# Patient Record
Sex: Male | Born: 1960
Health system: Southern US, Community
[De-identification: ages and names within clinical notes are randomized; demographics above are authoritative.]

## PROBLEM LIST (undated history)

## (undated) DIAGNOSIS — E785 Hyperlipidemia, unspecified: Secondary | ICD-10-CM

---

## 2008-01-19 ENCOUNTER — Ambulatory Visit: Payer: Self-pay | Admitting: Family Medicine

## 2013-07-06 ENCOUNTER — Ambulatory Visit: Payer: Self-pay | Admitting: Family Medicine

## 2013-07-06 ENCOUNTER — Ambulatory Visit: Payer: Self-pay | Admitting: Internal Medicine

## 2013-07-06 LAB — COMPREHENSIVE METABOLIC PANEL
ALBUMIN: 3.4 g/dL (ref 3.4–5.0)
AST: 16 U/L (ref 15–37)
Alkaline Phosphatase: 63 U/L
Anion Gap: 7 (ref 7–16)
BUN: 13 mg/dL (ref 7–18)
Bilirubin,Total: 0.7 mg/dL (ref 0.2–1.0)
CALCIUM: 8.7 mg/dL (ref 8.5–10.1)
CHLORIDE: 103 mmol/L (ref 98–107)
CREATININE: 1.18 mg/dL (ref 0.60–1.30)
Co2: 29 mmol/L (ref 21–32)
EGFR (African American): >60
EGFR (Non-African Amer.): >60
GLUCOSE: 95 mg/dL (ref 65–99)
OSMOLALITY: 277 (ref 275–301)
Potassium: 4.1 mmol/L (ref 3.5–5.1)
SGPT (ALT): 19 U/L (ref 12–78)
SODIUM: 139 mmol/L (ref 136–145)
TOTAL PROTEIN: 7.4 g/dL (ref 6.4–8.2)

## 2013-07-06 LAB — URINALYSIS, COMPLETE
Bacteria: NEGATIVE
Bilirubin,UR: NEGATIVE
Blood: NEGATIVE
Glucose,UR: NEGATIVE mg/dL (ref 0–75)
KETONE: NEGATIVE
Leukocyte Esterase: NEGATIVE
Nitrite: NEGATIVE
PROTEIN: NEGATIVE
Ph: 6 (ref 4.5–8.0)
SQUAMOUS EPITHELIAL: NONE SEEN

## 2013-07-06 LAB — CBC WITH DIFFERENTIAL/PLATELET
BASOS ABS: 0 10*3/uL (ref 0.0–0.1)
Basophil %: 0.7 %
EOS ABS: 0.1 10*3/uL (ref 0.0–0.7)
Eosinophil %: 1.7 %
HCT: 51.6 % (ref 40.0–52.0)
HGB: 16.7 g/dL (ref 13.0–18.0)
LYMPHS PCT: 41.2 %
Lymphocyte #: 2.8 10*3/uL (ref 1.0–3.6)
MCH: 29.4 pg (ref 26.0–34.0)
MCHC: 32.4 g/dL (ref 32.0–36.0)
MCV: 91 fL (ref 80–100)
MONO ABS: 0.8 x10 3/mm (ref 0.2–1.0)
MONOS PCT: 11.4 %
Neutrophil #: 3.1 10*3/uL (ref 1.4–6.5)
Neutrophil %: 45 %
Platelet: 208 10*3/uL (ref 150–440)
RBC: 5.68 10*6/uL (ref 4.40–5.90)
RDW: 13.2 % (ref 11.5–14.5)
WBC: 6.9 10*3/uL (ref 3.8–10.6)

## 2013-07-06 LAB — GC/CHLAMYDIA PROBE AMP

## 2013-07-06 LAB — SEDIMENTATION RATE: ERYTHROCYTE SED RATE: 1 mm/h (ref 0–20)

## 2015-12-16 ENCOUNTER — Encounter: Payer: Self-pay | Admitting: *Deleted

## 2015-12-17 ENCOUNTER — Encounter
Admission: RE | Disposition: A | Discharge status: Home / Self Care | Payer: Self-pay | Source: Ambulatory Visit | Attending: Gastroenterology

## 2015-12-17 ENCOUNTER — Ambulatory Visit: Payer: Commercial Managed Care - HMO | Admitting: Anesthesiology

## 2015-12-17 ENCOUNTER — Encounter: Payer: Self-pay | Admitting: *Deleted

## 2015-12-17 ENCOUNTER — Ambulatory Visit
Admission: RE | Admit: 2015-12-17 | Discharge: 2015-12-17 | Disposition: A | Discharge status: Home / Self Care | Payer: Commercial Managed Care - HMO | Source: Ambulatory Visit | Attending: Gastroenterology | Admitting: Gastroenterology

## 2015-12-17 ENCOUNTER — Other Ambulatory Visit: Payer: Self-pay | Admitting: Gastroenterology

## 2015-12-17 DIAGNOSIS — D123 Benign neoplasm of transverse colon: Secondary | ICD-10-CM | POA: Diagnosis not present

## 2015-12-17 DIAGNOSIS — D125 Benign neoplasm of sigmoid colon: Secondary | ICD-10-CM | POA: Diagnosis not present

## 2015-12-17 DIAGNOSIS — R634 Abnormal weight loss: Secondary | ICD-10-CM

## 2015-12-17 DIAGNOSIS — E785 Hyperlipidemia, unspecified: Secondary | ICD-10-CM | POA: Insufficient documentation

## 2015-12-17 DIAGNOSIS — Z1211 Encounter for screening for malignant neoplasm of colon: Secondary | ICD-10-CM | POA: Insufficient documentation

## 2015-12-17 DIAGNOSIS — Z87891 Personal history of nicotine dependence: Secondary | ICD-10-CM

## 2015-12-17 DIAGNOSIS — Z800 Family history of malignant neoplasm of digestive organs: Secondary | ICD-10-CM | POA: Diagnosis not present

## 2015-12-17 HISTORY — DX: Hyperlipidemia, unspecified: E78.5

## 2015-12-17 HISTORY — PX: COLONOSCOPY WITH PROPOFOL: SHX5780

## 2015-12-17 SURGERY — COLONOSCOPY WITH PROPOFOL
Anesthesia: General

## 2015-12-17 MED ORDER — SODIUM CHLORIDE 0.9 % IV SOLN: INTRAVENOUS | Status: DC

## 2015-12-17 MED ORDER — PROPOFOL 10 MG/ML IV BOLUS
INTRAVENOUS | Status: DC | PRN
  Administered 2015-12-17: 30 mg via INTRAVENOUS

## 2015-12-17 MED ORDER — PROPOFOL 500 MG/50ML IV EMUL
INTRAVENOUS | Status: DC | PRN
  Administered 2015-12-17: 120 ug/kg/min via INTRAVENOUS

## 2015-12-17 MED ORDER — FENTANYL CITRATE (PF) 100 MCG/2ML IJ SOLN
INTRAMUSCULAR | Status: DC | PRN
  Administered 2015-12-17: 50 ug via INTRAVENOUS

## 2015-12-17 MED ORDER — SODIUM CHLORIDE 0.9 % IV SOLN
INTRAVENOUS | Status: DC
  Administered 2015-12-17: 11:00:00 via INTRAVENOUS

## 2015-12-17 MED ORDER — MIDAZOLAM HCL 2 MG/2ML IJ SOLN
INTRAMUSCULAR | Status: DC | PRN
  Administered 2015-12-17: 1 mg via INTRAVENOUS

## 2015-12-17 NOTE — Transfer of Care (Signed)
Immediate Anesthesia Transfer of Care Note  Patient: Barry Lee  Procedure(s) Performed: Procedure(s): COLONOSCOPY WITH PROPOFOL (N/A)  Patient Location: PACU  Anesthesia Type:General  Level of Consciousness: awake and alert   Airway & Oxygen Therapy: Patient Spontanous Breathing and Patient connected to nasal cannula oxygen  Post-op Assessment: Report given to RN  Post vital signs: Reviewed  Last Vitals:  Vitals:   12/17/15 1041  BP: (!) 150/85  Pulse: (!) 44  Resp: 16  Temp: 36.6 C    Last Pain:  Vitals:   12/17/15 1041  TempSrc: Tympanic         Complications: No apparent anesthesia complications

## 2015-12-17 NOTE — H&P (Addendum)
Outpatient short stay form Pre-procedure 12/17/2015 11:12 AM Lollie Sails MD  Primary Physician: Dr. Thereasa Distance  Reason for visit:  Colonoscopy  History of present illness:  Patient is a 55 year old male presenting today for screening colonoscopy. This is his first colonoscopy. He tolerated his prep well. He takes no aspirin or blood thinning agents. There is a family history of colon cancer in 2 primary relatives.    Current Facility-Administered Medications:  .  0.9 %  sodium chloride infusion, , Intravenous, Continuous, Lollie Sails, MD, Last Rate: 20 mL/hr at 12/17/15 1053 .  0.9 %  sodium chloride infusion, , Intravenous, Continuous, Lollie Sails, MD  No prescriptions prior to admission.     No Known Allergies   Past Medical History:  Diagnosis Date  . Hyperlipidemia     Review of systems:      Physical Exam    Heart and lungs: Regular rate and rhythm without rub or gallop, lungs are bilaterally clear.    HEENT: Normocephalic atraumatic eyes are anicteric    Other:     Pertinant exam for procedure: Soft nontender nondistended bowel sounds positive normoactive. There is a firm fullness in the ruq, extending toward the rlq, nontender.     Planned proceedures: Colonoscopy and indicated procedures. I have discussed the risks benefits and complications of procedures to include not limited to bleeding, infection, perforation and the risk of sedation and the patient wishes to proceed.    Lollie Sails, MD Gastroenterology 12/17/2015  11:12 AM

## 2015-12-17 NOTE — Anesthesia Preprocedure Evaluation (Signed)
Anesthesia Evaluation  Patient identified by MRN, date of birth, ID band Patient awake    Reviewed: Allergy & Precautions, NPO status , Patient's Chart, lab work & pertinent test results  Airway Mallampati: II       Dental  (+) Teeth Intact   Pulmonary neg pulmonary ROS, Current Smoker,    breath sounds clear to auscultation       Cardiovascular Exercise Tolerance: Good negative cardio ROS   Rhythm:Regular     Neuro/Psych    GI/Hepatic negative GI ROS, Neg liver ROS,   Endo/Other  negative endocrine ROS  Renal/GU negative Renal ROS     Musculoskeletal negative musculoskeletal ROS (+)   Abdominal Normal abdominal exam  (+)   Peds negative pediatric ROS (+)  Hematology negative hematology ROS (+)   Anesthesia Other Findings   Reproductive/Obstetrics                             Anesthesia Physical Anesthesia Plan  ASA: II  Anesthesia Plan: General   Post-op Pain Management:    Induction: Intravenous  Airway Management Planned: Natural Airway and Nasal Cannula  Additional Equipment:   Intra-op Plan:   Post-operative Plan:   Informed Consent: I have reviewed the patients History and Physical, chart, labs and discussed the procedure including the risks, benefits and alternatives for the proposed anesthesia with the patient or authorized representative who has indicated his/her understanding and acceptance.     Plan Discussed with:   Anesthesia Plan Comments:         Anesthesia Quick Evaluation

## 2015-12-17 NOTE — Anesthesia Postprocedure Evaluation (Signed)
Anesthesia Post Note  Patient: Barry Lee  Procedure(s) Performed: Procedure(s) (LRB): COLONOSCOPY WITH PROPOFOL (N/A)  Patient location during evaluation: PACU Anesthesia Type: General Level of consciousness: awake Pain management: pain level controlled Vital Signs Assessment: post-procedure vital signs reviewed and stable Respiratory status: spontaneous breathing Cardiovascular status: stable Anesthetic complications: no    Last Vitals:  Vitals:   12/17/15 1041  BP: (!) 150/85  Pulse: (!) 44  Resp: 16  Temp: 36.6 C    Last Pain:  Vitals:   12/17/15 1041  TempSrc: Tympanic                 VAN STAVEREN,Sonnia Strong

## 2015-12-17 NOTE — Op Note (Signed)
[facility] Gastroenterology Patient Name: Barry Lee Procedure Date: 12/17/2015 11:15 AM MRN: [id number] Account #: [id number] Date of Birth: 01/04/1961 Admit Type: Outpatient Age: 55 Room: [facility] ENDO ROOM 1 Gender: Male Note Status: Finalized Procedure:            Colonoscopy Indications:          Colon cancer screening in patient at increased risk:                        Colorectal cancer in brother Providers:            Lollie Sails, MD Referring MD:         Sofie Hartigan (Referring MD) Medicines:            Monitored Anesthesia Care Complications:        No immediate complications. Procedure:            Pre-Anesthesia Assessment:                       - ASA Grade Assessment: II - A patient with mild                        systemic disease.                       After obtaining informed consent, the colonoscope was                        passed under direct vision. Throughout the procedure,                        the patient's blood pressure, pulse, and oxygen                        saturations were monitored continuously. The                        Colonoscope was introduced through the anus and                        advanced to the the cecum, identified by appendiceal                        orifice and ileocecal valve. The patient tolerated the                        procedure well. The quality of the bowel preparation                        was fair. Findings:      A 2 mm polyp was found in the hepatic flexure. The polyp was sessile.       The polyp was removed with a cold biopsy forceps. Resection and       retrieval were complete.      A 1 mm polyp was found in the distal sigmoid colon. The polyp was       sessile. The polyp was removed with a cold biopsy forceps. Resection and       retrieval were complete.      The exam was otherwise normal throughout the examined colon.  The digital rectal exam was normal. Impression:           -  Preparation of the colon was fair.                       - One 2 mm polyp at the hepatic flexure, removed with a                        cold biopsy forceps. Resected and retrieved.                       - One 1 mm polyp in the distal sigmoid colon, removed                        with a cold biopsy forceps. Resected and retrieved. Recommendation:       - Discharge patient to home.                       - Await pathology results.                       - Telephone GI clinic for pathology results in 1 week.                       - On physical examination, there is a firm fullness in                        the ruq on palpation. Will order ruq ultrasound for                        abnormal abdominal examination. Procedure Code(s):    --- Professional ---                       [phone], Colonoscopy, flexible; with biopsy, single or                        multiple Diagnosis Code(s):    --- Professional ---                       Z80.0, Family history of malignant neoplasm of                        digestive organs                       D12.3, Benign neoplasm of transverse colon (hepatic                        flexure or splenic flexure)                       D12.5, Benign neoplasm of sigmoid colon CPT copyright 2016 American Medical Association. All rights reserved. The codes documented in this report are preliminary and upon coder review may  be revised to meet current compliance requirements. Lollie Sails, MD 12/17/2015 11:59:38 AM This report has been signed electronically. Number of Addenda: 0 Note Initiated On: 12/17/2015 11:15 AM Scope Withdrawal Time: 0 hours 15 minutes 6 seconds  Total Procedure Duration: 0 hours 26 minutes 59 seconds       [facility]

## 2015-12-18 ENCOUNTER — Encounter: Payer: Self-pay | Admitting: Gastroenterology

## 2015-12-19 LAB — SURGICAL PATHOLOGY

## 2015-12-23 ENCOUNTER — Ambulatory Visit: Admission: RE | Admit: 2015-12-23 | Payer: Commercial Managed Care - HMO | Source: Ambulatory Visit

## 2015-12-25 ENCOUNTER — Other Ambulatory Visit: Payer: Self-pay | Admitting: Gastroenterology

## 2015-12-25 DIAGNOSIS — R198 Other specified symptoms and signs involving the digestive system and abdomen: Secondary | ICD-10-CM

## 2016-01-01 ENCOUNTER — Ambulatory Visit: Admission: RE | Admit: 2016-01-01 | Payer: Commercial Managed Care - HMO | Source: Ambulatory Visit

## 2016-01-05 ENCOUNTER — Ambulatory Visit
Admission: RE | Admit: 2016-01-05 | Discharge: 2016-01-05 | Disposition: A | Discharge status: Home / Self Care | Payer: Commercial Managed Care - HMO | Source: Ambulatory Visit | Attending: Gastroenterology | Admitting: Gastroenterology

## 2016-01-05 DIAGNOSIS — R198 Other specified symptoms and signs involving the digestive system and abdomen: Secondary | ICD-10-CM | POA: Diagnosis not present

## 2017-03-04 ENCOUNTER — Encounter: Payer: Self-pay | Admitting: Podiatry

## 2017-03-04 ENCOUNTER — Ambulatory Visit (INDEPENDENT_AMBULATORY_CARE_PROVIDER_SITE_OTHER): Payer: 59 | Admitting: Podiatry

## 2017-03-04 DIAGNOSIS — Q828 Other specified congenital malformations of skin: Secondary | ICD-10-CM

## 2017-03-04 DIAGNOSIS — I739 Peripheral vascular disease, unspecified: Secondary | ICD-10-CM

## 2017-03-04 MED ORDER — UREA 39 % EX CREA: 1.0000 "application " | TOPICAL_CREAM | Freq: Every day | CUTANEOUS | 0 refills | Status: AC

## 2017-03-04 NOTE — Progress Notes (Signed)
Subjective:    Patient ID: Barry Lee, male    DOB: 1961-01-05, 56 y.o.   MRN: [id number]  HPI  56 year old male presents the office today for concerns of painful calluses to both of his feet. He states the left side is more painful than the right. He states that the pain and left foot goes into his leg. He denies any claudication symptoms and denies any numbness or tingling. He does stand working on hard surface for 8-10 hours a day. He has no other concerns today. He denies any recent swelling or redness or drainage coming from the calluses.   Review of Systems  All other systems reviewed and are negative.  Past Medical History:  Diagnosis Date  . Hyperlipidemia     Past Surgical History:  Procedure Laterality Date  . COLONOSCOPY WITH PROPOFOL N/A 12/17/2015   Procedure: COLONOSCOPY WITH PROPOFOL;  Surgeon: Lollie Sails, MD;  Location: [facility] ENDOSCOPY;  Service: Endoscopy;  Laterality: N/A;     Current Outpatient Prescriptions:  .  Urea 39 % CREA, Apply 1 application topically daily., Disp: 1 Bottle, Rfl: 0  No Known Allergies  Social History   Social History  . Marital status: Married    Spouse name: N/A  . Number of children: N/A  . Years of education: N/A   Occupational History  . Not on file.   Social History Main Topics  . Smoking status: Current Every Day Smoker    Types: Cigars  . Smokeless tobacco: Never Used  . Alcohol use No     Comment: rare  . Drug use: No  . Sexual activity: Not on file   Other Topics Concern  . Not on file   Social History Narrative  . No narrative on file         Objective:   Physical Exam General: AAO x3, NAD  Dermatological: Multiple hyperkeratotic lesions present bilateral submetatarsal as well as the heel. Upon debridement there is no underlying ulceration, drainage or any clinical signs of infection present. There is no other open lesions or pre-ulcerous at this time.  Vascular: On the right side DP/PT  pulse 2/4 on the left side it does appear to be decreased at 1/4 to the [facility] and PT compared to the contralateral extremity.  Neruologic: Grossly intact via light touch bilateral. Protective threshold with Semmes Wienstein monofilament intact to all pedal sites bilateral.   Musculoskeletal: Tenderness of the hyperkeratotic lesions. This prominence the metatarsal heads plantarly. No other areas of tenderness there is no area of pinpoint bony tenderness. Muscular strength 5/5 in all groups tested bilateral.  Gait: Unassisted, Nonantalgic.     Assessment & Plan:  56 year old male with symptomatic calluses bilaterally; decreased pulses left side with current tobacco use -Treatment options discussed including all alternatives, risks, and complications -Etiology of symptoms were discussed -I debrided all hyperkeratotic lesions  without any complications or bleeding. Discussed with him long-term potential surgery but I think we would benefit more from started with an accommodative type orthotic. I would have him follow up with Liliane Channel to get molded for inserts. -Given the decreased pulses in the left side compared to contralateral extremity as well as pain and he can tell leg I did an EBI the office. On the left-sided is 0.86 in the right was 1.26. Although mildly abnormal given the decreased pulses I will order an arterial duplex and schedule consultation for this given the abnormal result on the pilot study.   Celesta Gentile, DPM

## 2017-03-07 ENCOUNTER — Encounter (HOSPITAL_COMMUNITY): Payer: Self-pay | Admitting: Cardiovascular Disease

## 2017-03-07 ENCOUNTER — Telehealth: Payer: Self-pay | Admitting: *Deleted

## 2017-03-07 DIAGNOSIS — R0989 Other specified symptoms and signs involving the circulatory and respiratory systems: Secondary | ICD-10-CM

## 2017-03-07 NOTE — Telephone Encounter (Signed)
-----   Message from Trula Slade, DPM sent at 03/07/2017  8:04 AM EDT ----- Can you please put in for an arterial duplex and follow-up/consult given abnormal reading on the pilot ABI study? Thanks.

## 2017-03-10 ENCOUNTER — Other Ambulatory Visit: Payer: Self-pay | Admitting: Podiatry

## 2017-03-10 DIAGNOSIS — R0989 Other specified symptoms and signs involving the circulatory and respiratory systems: Secondary | ICD-10-CM

## 2017-03-17 ENCOUNTER — Emergency Department: Payer: 59

## 2017-03-17 ENCOUNTER — Encounter: Payer: Self-pay | Admitting: Emergency Medicine

## 2017-03-17 ENCOUNTER — Inpatient Hospital Stay
Admission: EM | Admit: 2017-03-17 | Discharge: 2017-03-19 | DRG: 271 | Disposition: A | Discharge status: Home / Self Care | Payer: 59 | Attending: Internal Medicine | Admitting: Internal Medicine

## 2017-03-17 ENCOUNTER — Ambulatory Visit: Payer: 59 | Admitting: Orthotics

## 2017-03-17 ENCOUNTER — Other Ambulatory Visit: Payer: Self-pay

## 2017-03-17 ENCOUNTER — Ambulatory Visit (INDEPENDENT_AMBULATORY_CARE_PROVIDER_SITE_OTHER): Payer: 59

## 2017-03-17 DIAGNOSIS — R7301 Impaired fasting glucose: Secondary | ICD-10-CM | POA: Diagnosis present

## 2017-03-17 DIAGNOSIS — M722 Plantar fascial fibromatosis: Secondary | ICD-10-CM

## 2017-03-17 DIAGNOSIS — Z79899 Other long term (current) drug therapy: Secondary | ICD-10-CM

## 2017-03-17 DIAGNOSIS — R0989 Other specified symptoms and signs involving the circulatory and respiratory systems: Secondary | ICD-10-CM | POA: Diagnosis not present

## 2017-03-17 DIAGNOSIS — F17200 Nicotine dependence, unspecified, uncomplicated: Secondary | ICD-10-CM | POA: Diagnosis not present

## 2017-03-17 DIAGNOSIS — F17210 Nicotine dependence, cigarettes, uncomplicated: Secondary | ICD-10-CM | POA: Diagnosis present

## 2017-03-17 DIAGNOSIS — Z820 Family history of epilepsy and other diseases of the nervous system: Secondary | ICD-10-CM

## 2017-03-17 DIAGNOSIS — E785 Hyperlipidemia, unspecified: Secondary | ICD-10-CM | POA: Diagnosis present

## 2017-03-17 DIAGNOSIS — I749 Embolism and thrombosis of unspecified artery: Secondary | ICD-10-CM

## 2017-03-17 DIAGNOSIS — I739 Peripheral vascular disease, unspecified: Secondary | ICD-10-CM

## 2017-03-17 DIAGNOSIS — M79605 Pain in left leg: Secondary | ICD-10-CM | POA: Diagnosis present

## 2017-03-17 DIAGNOSIS — I70222 Atherosclerosis of native arteries of extremities with rest pain, left leg: Principal | ICD-10-CM | POA: Diagnosis present

## 2017-03-17 DIAGNOSIS — I7092 Chronic total occlusion of artery of the extremities: Secondary | ICD-10-CM | POA: Diagnosis present

## 2017-03-17 DIAGNOSIS — I708 Atherosclerosis of other arteries: Secondary | ICD-10-CM | POA: Diagnosis present

## 2017-03-17 DIAGNOSIS — I998 Other disorder of circulatory system: Secondary | ICD-10-CM | POA: Diagnosis present

## 2017-03-17 DIAGNOSIS — I70229 Atherosclerosis of native arteries of extremities with rest pain, unspecified extremity: Secondary | ICD-10-CM

## 2017-03-17 DIAGNOSIS — Z833 Family history of diabetes mellitus: Secondary | ICD-10-CM | POA: Diagnosis not present

## 2017-03-17 DIAGNOSIS — Q828 Other specified congenital malformations of skin: Secondary | ICD-10-CM

## 2017-03-17 LAB — CBC WITH DIFFERENTIAL/PLATELET
Basophils Absolute: 0.1 10*3/uL (ref 0–0.1)
Basophils Relative: 1 %
Eosinophils Absolute: 0.1 10*3/uL (ref 0–0.7)
Eosinophils Relative: 1 %
HEMATOCRIT: 50.9 % (ref 40.0–52.0)
HEMOGLOBIN: 16.6 g/dL (ref 13.0–18.0)
LYMPHS ABS: 2.8 10*3/uL (ref 1.0–3.6)
LYMPHS PCT: 34 %
MCH: 30.6 pg (ref 26.0–34.0)
MCHC: 32.6 g/dL (ref 32.0–36.0)
MCV: 93.7 fL (ref 80.0–100.0)
MONOS PCT: 10 %
Monocytes Absolute: 0.9 10*3/uL (ref 0.2–1.0)
NEUTROS PCT: 54 %
Neutro Abs: 4.5 10*3/uL (ref 1.4–6.5)
Platelets: 169 10*3/uL (ref 150–440)
RBC: 5.43 MIL/uL (ref 4.40–5.90)
RDW: 14.4 % (ref 11.5–14.5)
WBC: 8.3 10*3/uL (ref 3.8–10.6)

## 2017-03-17 LAB — BASIC METABOLIC PANEL
Anion gap: 8 (ref 5–15)
BUN: 10 mg/dL (ref 6–20)
CHLORIDE: 104 mmol/L (ref 101–111)
CO2: 27 mmol/L (ref 22–32)
Calcium: 8.8 mg/dL — ABNORMAL LOW (ref 8.9–10.3)
Creatinine, Ser: 0.91 mg/dL (ref 0.61–1.24)
GFR calc Af Amer: >60 mL/min (ref >60)
GFR calc non Af Amer: >60 mL/min (ref >60)
GLUCOSE: 79 mg/dL (ref 65–99)
POTASSIUM: 4.2 mmol/L (ref 3.5–5.1)
Sodium: 139 mmol/L (ref 135–145)

## 2017-03-17 LAB — APTT: aPTT: 32 seconds (ref 24–36)

## 2017-03-17 LAB — LACTIC ACID, PLASMA: LACTIC ACID, VENOUS: 1.4 mmol/L (ref 0.5–1.9)

## 2017-03-17 LAB — PROTIME-INR
INR: 0.93
Prothrombin Time: 12.4 seconds (ref 11.4–15.2)

## 2017-03-17 MED ORDER — ONDANSETRON HCL 4 MG/2ML IJ SOLN: 4.0000 mg | Freq: Four times a day (QID) | INTRAMUSCULAR | Status: DC | PRN

## 2017-03-17 MED ORDER — ACETAMINOPHEN 650 MG RE SUPP
650.0000 mg | Freq: Four times a day (QID) | RECTAL | Status: DC | PRN
Start: 2017-03-17 — End: 2017-03-19

## 2017-03-17 MED ORDER — IOPAMIDOL (ISOVUE-370) INJECTION 76%
125.0000 mL | Freq: Once | INTRAVENOUS | Status: AC | PRN
  Administered 2017-03-17: 125 mL via INTRAVENOUS

## 2017-03-17 MED ORDER — SODIUM CHLORIDE 0.9 % IV BOLUS (SEPSIS)
1000.0000 mL | Freq: Once | INTRAVENOUS | Status: AC
  Administered 2017-03-17: 1000 mL via INTRAVENOUS

## 2017-03-17 MED ORDER — HEPARIN BOLUS VIA INFUSION
4600.0000 [IU] | Freq: Once | INTRAVENOUS | Status: AC
  Administered 2017-03-17: 4600 [IU] via INTRAVENOUS
  Filled 2017-03-17: qty 4600

## 2017-03-17 MED ORDER — ACETAMINOPHEN 325 MG PO TABS: 650.0000 mg | ORAL_TABLET | Freq: Four times a day (QID) | ORAL | Status: DC | PRN

## 2017-03-17 MED ORDER — HEPARIN (PORCINE) IN NACL 100-0.45 UNIT/ML-% IJ SOLN
1300.0000 [IU]/h | INTRAMUSCULAR | Status: DC
  Administered 2017-03-17: 1300 [IU]/h via INTRAVENOUS
  Filled 2017-03-17 (×2): qty 250

## 2017-03-17 MED ORDER — MORPHINE SULFATE (PF) 2 MG/ML IV SOLN: 2.0000 mg | INTRAVENOUS | Status: DC | PRN

## 2017-03-17 MED ORDER — ONDANSETRON HCL 4 MG PO TABS: 4.0000 mg | ORAL_TABLET | Freq: Four times a day (QID) | ORAL | Status: DC | PRN

## 2017-03-17 MED ORDER — HYDROCODONE-ACETAMINOPHEN 5-325 MG PO TABS: 1.0000 | ORAL_TABLET | ORAL | Status: DC | PRN

## 2017-03-17 NOTE — H&P (Signed)
[place] [facility] [place] NAME: Barry Lee    MR#:  [id number]  DATE OF BIRTH:  [birth year]  DATE OF ADMISSION:  03/17/2017  PRIMARY CARE PHYSICIAN: Ok Edwards, NP   REQUESTING/REFERRING PHYSICIAN: Dr. Merlyn Lot  CHIEF COMPLAINT:   Chief Complaint  Patient presents with  . Leg Pain    HISTORY OF PRESENT ILLNESS:  Barry Lee  is a 56 y.o. male with a known history of tobacco abuse who presents to the hospital due to left lower extremity pain. Patient says he developed some left lower extremity pain 2 weeks ago while he was mowing the lawn but it resolved on its own. 2 nights ago patient developed some left foot pain in the middle lobe and right which kept him up most of the night but it eventually resolved. Patient had another episode of left foot pain last night and therefore was concerned and therefore came to the ER for further evaluation today. Patient underwent a CT angiogram showed a thrombus in the left tibial and peroneal tibial trunk along with a complete occlusion of the left common femoral artery. Patient has been initiated on a heparin drip and hospitalist services were contacted further treatment evaluation.  PAST MEDICAL HISTORY:   Past Medical History:  Diagnosis Date  . Hyperlipidemia     PAST SURGICAL HISTORY:  History reviewed. No pertinent surgical history.  SOCIAL HISTORY:   Social History   Tobacco Use  . Smoking status: Current Every Day Smoker    Packs/day: 0.75    Years: 30.00    Pack years: 22.50    Types: Cigars, Cigarettes  . Smokeless tobacco: Never Used  Substance Use Topics  . Alcohol use: No    Comment: rare    FAMILY HISTORY:   Family History  Problem Relation Age of Onset  . Diabetes Mother   . Alzheimer's disease Father     DRUG ALLERGIES:  No Known Allergies  REVIEW OF SYSTEMS:   Review of Systems  Constitutional: Negative for fever and weight loss.  HENT:  Negative for congestion, nosebleeds and tinnitus.   Eyes: Negative for blurred vision, double vision and redness.  Respiratory: Negative for cough, hemoptysis and shortness of breath.   Cardiovascular: Negative for chest pain, orthopnea, leg swelling and PND.  Gastrointestinal: Negative for abdominal pain, diarrhea, melena, nausea and vomiting.  Genitourinary: Negative for dysuria, hematuria and urgency.  Musculoskeletal: Negative for falls and joint pain.  Neurological: Negative for dizziness, tingling, sensory change, focal weakness, seizures, weakness and headaches.  Endo/Heme/Allergies: Negative for polydipsia. Does not bruise/bleed easily.  Psychiatric/Behavioral: Negative for depression and memory loss. The patient is not nervous/anxious.     MEDICATIONS AT HOME:   Prior to Admission medications   Medication Sig Start Date End Date Taking? Authorizing Provider  Urea 39 % CREA Apply 1 application topically daily. 03/04/17  Yes Trula Slade, DPM      VITAL SIGNS:  Blood pressure 139/82, pulse (!) 51, temperature 97.9 F (36.6 C), temperature source Oral, resp. rate 16, height 6' (1.829 m), weight 77.1 kg (170 lb), SpO2 99 %.  PHYSICAL EXAMINATION:  Physical Exam  GENERAL:  56 y.o.-year-old patient lying in the bed in no acute distress.  EYES: Pupils equal, round, reactive to light and accommodation. No scleral icterus. Extraocular muscles intact.  HEENT: Head atraumatic, normocephalic. Oropharynx and nasopharynx clear. No oropharyngeal erythema, moist oral mucosa  NECK:  Supple, no jugular venous distention. No thyroid  enlargement, no tenderness.  LUNGS: Normal breath sounds bilaterally, no wheezing, rales, rhonchi. No use of accessory muscles of respiration.  CARDIOVASCULAR: S1, S2 RRR. No murmurs, rubs, gallops, clicks.  ABDOMEN: Soft, nontender, nondistended. Bowel sounds present. No organomegaly or mass.  EXTREMITIES: No pedal edema, cyanosis, or clubbing.  Cool left  foot but no cyanosis.   NEUROLOGIC: Cranial nerves II through XII are intact. No focal Motor or sensory deficits appreciated b/l PSYCHIATRIC: The patient is alert and oriented x 3. Good affect.  SKIN: No obvious rash, lesion, or ulcer.   LABORATORY PANEL:   CBC Recent Labs  Lab 03/17/17 1613  WBC 8.3  HGB 16.6  HCT 50.9  PLT 169   ------------------------------------------------------------------------------------------------------------------  Chemistries  Recent Labs  Lab 03/17/17 1613  NA 139  K 4.2  CL 104  CO2 27  GLUCOSE 79  BUN 10  CREATININE 0.91  CALCIUM 8.8*   ------------------------------------------------------------------------------------------------------------------  Cardiac Enzymes No results for input(s): TROPONINI in the last 168 hours. ------------------------------------------------------------------------------------------------------------------  RADIOLOGY:  Ct Angio Aortobifemoral W And/or Wo Contrast  Result Date: 03/17/2017 CLINICAL DATA:  Severe left foot pain this morning. Intermittent Doppler pulses. EXAM: CT ANGIOGRAPHY AOBIFEM WITHOUT AND WITH CONTRAST<Procedure Description>CT ANGIOGRAPHY AOBIFEM WITHOUT AND WITH CONTRAST TECHNIQUE: Axial images were obtained from the level of the lower chest through the feet after administration of IV contrast material. Coronal and sagittal reconstructions were performed. CONTRAST:  153mL ISOVUE-370 IOPAMIDOL (ISOVUE-370) INJECTION 76%<Contrast>149mL ISOVUE-370 IOPAMIDOL (ISOVUE-370) INJECTION 76% COMPARISON:  None. FINDINGS: Scattered mild atherosclerosis of the normal caliber abdominal aorta. Normal flow within the celiac, superior mesenteric and inferior mesenteric artery branches. Complete occlusion within the proximal portion of the left common iliac artery. No flow seen within the left external iliac artery. There is reconstitution of flow within the left internal iliac artery and at the left femoral  artery via collaterals. Normal flow is seen throughout the left SFA and profundus arteries. Normal flow is seen within the left popliteal artery. Partially occlusive thrombus noted at the junction of the left anterior tibial artery and tibioperoneal trunk. This is reflected by diminished flow in the anterior tibial artery and no appreciable flow in the anterior tibial artery just above the level of the left ankle. Some flow is seen within the left peroneal and posterior tibial arteries. There is normal flow throughout the right lower extremity. Liver, gallbladder, pancreas, and spleen appear normal. Adrenal glands and kidneys are unremarkable. Small right renal cyst. Incidental note made of a retroaortic left renal vein. No dilated large or small bowel loops. No evidence of bowel wall thickening or bowel wall inflammation seen. No free fluid or abscess collection within the abdomen or pelvis. No free intraperitoneal air seen. Osseous structures are unremarkable. Lung bases are clear. Review of the MIP images confirms the above findings. IMPRESSION: 1. 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 There is associated diminished flow within the left anterior tibial artery with no appreciable flow in the left anterior tibia artery just above the level of the left ankle. Grossly normal contrast flow is seen within the left peroneal and posterior tibial arteries to the level of the ankle. 2. 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 Reconstitution of flow within the left femoral artery via collaterals with normal contrast flow seen through  the left SFA and left popliteal artery. There is also reconstitution of flow in the left internal iliac artery via collaterals. 3. Normal contrast flow  throughout the right lower extremity arteries. 4. No acute appearing findings within the abdomen or pelvis. No evidence of neoplastic process seen. These results were called by telephone at the time of interpretation on 03/17/2017 at 6:30 pm to Dr. Merlyn Lot , who verbally acknowledged these results. Electronically Signed   By: Franki Cabot M.D.   On: 03/17/2017 18:34   Dg Chest Portable 1 View  Result Date: 03/17/2017 CLINICAL DATA:  Cough tobacco use.  Lower extremity claudication EXAM: PORTABLE CHEST 1 VIEW COMPARISON:  None. FINDINGS: The lungs are clear. Heart size and pulmonary vascularity are normal. No adenopathy. No bone lesions. Trachea appears normal. IMPRESSION: No edema or consolidation. Electronically Signed   By: Lowella Grip III M.D.   On: 03/17/2017 16:32     IMPRESSION AND PLAN:   56 year old male with past medical history of tobacco abuse who presents to the hospital due to left lower extremity pain.  1. Acute left lower extremity ischemia-this is the cause of patient's left lower extremity pain and swelling. -Patient has been initiated on a heparin drip. - We will get a vascular surgery consult, supportive care with pain control with IV morphine and oxycodone.  2. Tobacco abuse - pt. Refused a nicotine patch.      All the records are reviewed and case discussed with ED provider. Management plans discussed with the patient, family and they are in agreement.  CODE STATUS: Full code  TOTAL TIME TAKING CARE OF THIS PATIENT: 40 minutes.    Henreitta Leber M.D on 03/17/2017 at 7:25 PM  Between 7am to 6pm - Pager - [phone]  After 6pm go to [email or web address] - password [email or web address] [facility] [facility]  Office  [phone]  CC: Primary care physician; Ok Edwards, NP

## 2017-03-17 NOTE — ED Provider Notes (Signed)
[facility] Emergency Department Provider Note    None    (approximate)  I have reviewed the triage vital signs and the nursing notes.   HISTORY  Chief Complaint Leg Pain    HPI KEATYN JAWAD is a 56 y.o. male presents with chief complaint of worsening left leg pain as well as what was initially claudications down symptoms starting roughly 2 weeks ago now presents with worsening rest pain and burning sensation anytime he stands on his left foot.  Patient was seen by his foot doctor today and sent for peripheral arterial studies and they are unable to obtain arterial signals even up to the left femoral artery.  Had a cold leg and so he was sent to the ER for emergent evaluation.  States he does smoke roughly 1 pack/day since he was in college.  Denies any history of blood disorders.  No previous history of heart attack.  Denies any chest pain or shortness of breath.  No belly pain.  Denies any trauma.  Describes the pain is a burning sensation in mild to moderate in severity.  It is worse with standing.  Past Medical History:  Diagnosis Date  . Hyperlipidemia    Family History  Problem Relation Age of Onset  . Diabetes Mother   . Alzheimer's disease Father    History reviewed. No pertinent surgical history. Patient Active Problem List   Diagnosis Date Noted  . Ischemia of left lower extremity 03/17/2017      Prior to Admission medications   Medication Sig Start Date End Date Taking? Authorizing Provider  Urea 39 % CREA Apply 1 application topically daily. 03/04/17  Yes Trula Slade, DPM    Allergies Patient has no known allergies.    Social History Social History   Tobacco Use  . Smoking status: Current Every Day Smoker    Packs/day: 0.75    Years: 30.00    Pack years: 22.50    Types: Cigars, Cigarettes  . Smokeless tobacco: Never Used  Substance Use Topics  . Alcohol use: No    Comment: rare  . Drug use: No    Review of  Systems Patient denies headaches, rhinorrhea, blurry vision, numbness, shortness of breath, chest pain, edema, cough, abdominal pain, nausea, vomiting, diarrhea, dysuria, fevers, rashes or hallucinations unless otherwise stated above in HPI. ____________________________________________   PHYSICAL EXAM:  VITAL SIGNS: Vitals:   03/17/17 1814 03/17/17 1830  BP: (!) 155/86 139/82  Pulse: (!) 52 (!) 51  Resp: 12 16  Temp:    SpO2: 100% 99%    Constitutional: Alert and oriented. Well appearing and in no acute distress. Eyes: Conjunctivae are normal.  Head: Atraumatic. Nose: No congestion/rhinnorhea. Mouth/Throat: Mucous membranes are moist.   Neck: No stridor. Painless ROM.  Cardiovascular: Normal rate, regular rhythm. Grossly normal heart sounds.  Good peripheral circulation. Respiratory: Normal respiratory effort.  No retractions. Lungs CTAB. Gastrointestinal: Soft and nontender. No distention. No abdominal bruits. No CVA tenderness. Musculoskeletal: No lower extremity tenderness nor edema.  No joint effusions.  Absent left DP and PT pulses.  No palpable pulse in the popliteal fossa.  Unable to palpate left femoral pulse.  Right sided pulses are palpable and strong.  Left foot is cool to the touch but calf and thigh seem relatively well perfused.  Compartments are soft. Neurologic:  Normal speech and language. No gross focal neurologic deficits are appreciated. No facial droop Skin:  Skin is warm, dry and intact. No rash  noted. Psychiatric: Mood and affect are normal. Speech and behavior are normal.  ____________________________________________   LABS (all labs ordered are listed, but only abnormal results are displayed)  Results for orders placed or performed during the hospital encounter of 03/17/17 (from the past 24 hour(s))  CBC with Differential/Platelet     Status: None   Collection Time: 03/17/17  4:13 PM  Result Value Ref Range   WBC 8.3 3.8 - 10.6 K/uL   RBC 5.43 4.40 -  5.90 MIL/uL   Hemoglobin 16.6 13.0 - 18.0 g/dL   HCT 50.9 40.0 - 52.0 %   MCV 93.7 80.0 - 100.0 fL   MCH 30.6 26.0 - 34.0 pg   MCHC 32.6 32.0 - 36.0 g/dL   RDW 14.4 11.5 - 14.5 %   Platelets 169 150 - 440 K/uL   Neutrophils Relative % 54 %   Neutro Abs 4.5 1.4 - 6.5 K/uL   Lymphocytes Relative 34 %   Lymphs Abs 2.8 1.0 - 3.6 K/uL   Monocytes Relative 10 %   Monocytes Absolute 0.9 0.2 - 1.0 K/uL   Eosinophils Relative 1 %   Eosinophils Absolute 0.1 0 - 0.7 K/uL   Basophils Relative 1 %   Basophils Absolute 0.1 0 - 0.1 K/uL  Basic metabolic panel     Status: Abnormal   Collection Time: 03/17/17  4:13 PM  Result Value Ref Range   Sodium 139 135 - 145 mmol/L   Potassium 4.2 3.5 - 5.1 mmol/L   Chloride 104 101 - 111 mmol/L   CO2 27 22 - 32 mmol/L   Glucose, Bld 79 65 - 99 mg/dL   BUN 10 6 - 20 mg/dL   Creatinine, Ser 0.91 0.61 - 1.24 mg/dL   Calcium 8.8 (L) 8.9 - 10.3 mg/dL   GFR calc non Af Amer >60 >60 mL/min   GFR calc Af Amer >60 >60 mL/min   Anion gap 8 5 - 15  Protime-INR     Status: None   Collection Time: 03/17/17  4:13 PM  Result Value Ref Range   Prothrombin Time 12.4 11.4 - 15.2 seconds   INR 0.93   APTT     Status: None   Collection Time: 03/17/17  4:13 PM  Result Value Ref Range   aPTT 32 24 - 36 seconds  Lactic acid, plasma     Status: None   Collection Time: 03/17/17  4:14 PM  Result Value Ref Range   Lactic Acid, Venous 1.4 0.5 - 1.9 mmol/L   ____________________________________________  EKG My review and personal interpretation at Time: 15:33   Indication: right leg pain  Rate: 55  Rhythm: sinus Axis: normal Other: poor r wave progression, no stemi, normal intervals, ____________________________________________  RADIOLOGY  I personally reviewed all radiographic images ordered to evaluate for the above acute complaints and reviewed radiology reports and findings.  These findings were personally discussed with the patient.  Please see medical record  for radiology report.  ____________________________________________   PROCEDURES  Procedure(s) performed:  Procedures    Critical Care performed: yes CRITICAL CARE Performed by: Merlyn Lot   Total critical care time: 30 minutes  Critical care time was exclusive of separately billable procedures and treating other patients.  Critical care was necessary to treat or prevent imminent or life-threatening deterioration.  Critical care was time spent personally by me on the following activities: development of treatment plan with patient and/or surrogate as well as nursing, discussions with consultants, evaluation of patient's response  to treatment, examination of patient, obtaining history from patient or surrogate, ordering and performing treatments and interventions, ordering and review of laboratory studies, ordering and review of radiographic studies, pulse oximetry and re-evaluation of patient's condition.  ____________________________________________   INITIAL IMPRESSION / ASSESSMENT AND PLAN / ED COURSE  Pertinent labs & imaging results that were available during my care of the patient were reviewed by me and considered in my medical decision making (see chart for details).  DDX: disscetion, embolism, dvt, claudication, pad, msk strain  JOSUA FERREBEE is a 56 y.o. who presents to the ED with acute left leg pain as described above.  He is afebrile and hemodynamic was stable but does have evidence of critical ischemia to left lower extremity.  CT angiogram will be ordered for the above differential.  We will start IV fluids.  Evidence of dysrhythmia.  The patient will be placed on continuous pulse oximetry and telemetry for monitoring.  Laboratory evaluation will be sent to evaluate for the above complaints.     Clinical Course as of Mar 17 1948  Thu Mar 17, 2017  1838 Patient with evidence of completely obstructing left common iliac artery thrombus with evidence of  distal obstruction which would plan his sudden rest pain starting today.  Patient is currently on a heparin drip.  Spoke with Dr. Lucky Cowboy who will evaluate for thrombectomy in the morning.  Have discussed with the patient and available family all diagnostics and treatments performed thus far and all questions were answered to the best of my ability. The patient demonstrates understanding and agreement with plan.   [PR]    Clinical Course User Index [PR] Merlyn Lot, MD     ____________________________________________   FINAL CLINICAL IMPRESSION(S) / ED DIAGNOSES  Final diagnoses:  Arterial thromboembolism ([place])  Critical lower limb ischemia  Left leg pain      NEW MEDICATIONS STARTED DURING THIS VISIT:  This SmartLink is deprecated. Use AVSMEDLIST instead to display the medication list for a patient.   Note:  This document was prepared using Dragon voice recognition software and may include unintentional dictation errors.    Merlyn Lot, MD 03/17/17 1949

## 2017-03-17 NOTE — ED Notes (Addendum)
Admitting MD at bedside.     Family at bedside.

## 2017-03-17 NOTE — ED Notes (Signed)
Pt provided sandwich tray and coke at this time per request.

## 2017-03-17 NOTE — ED Notes (Signed)
Pt returned from CT °

## 2017-03-17 NOTE — ED Triage Notes (Addendum)
Pt c/o waking up this morning with severe left foot pain.  Now has pain to leg. Both feet cool to couch but around left ankle seems slightly cooler than right.  dopplered right DP puled and left tibial.  Unable to find left DP pulse with doppler at this time. Foot does not appear ischemic at this time.  Has [place] down here but read not in computer. Pt reports was told has a clot from hip to foot.  Denies CP/SHOB

## 2017-03-17 NOTE — ED Notes (Signed)
HEPARIN VERIFIED WITH IRIS RN

## 2017-03-17 NOTE — Progress Notes (Signed)
ANTICOAGULATION CONSULT NOTE - Initial Consult  Pharmacy Consult for Heparin  Indication: DVT  No Known Allergies  Patient Measurements: Height: 6' (182.9 cm) Weight: 170 lb (77.1 kg) IBW/kg (Calculated) : 77.6 Heparin Dosing Weight: 77.1 kg   Vital Signs: Temp: 97.9 F (36.6 C) (11/08 1513) Temp Source: Oral (11/08 1513) BP: 151/89 (11/08 1636) Pulse Rate: 50 (11/08 1636)  Labs: Recent Labs    03/17/17 1613  HGB 16.6  HCT 50.9  PLT 169  APTT 32  LABPROT 12.4  INR 0.93  CREATININE 0.91    Estimated Creatinine Clearance: 100 mL/min (by C-G formula based on SCr of 0.91 mg/dL).   Medical History: Past Medical History:  Diagnosis Date  . Hyperlipidemia     Medications:   (Not in a hospital admission)  Assessment: Pharmacy consulted to dose heparin in this 56 year old male admitted with VTE.  No prior anticoag noted. CrCl = 100 ml/min  Goal of Therapy:  Heparin level 0.3-0.7 units/ml Monitor platelets by anticoagulation protocol: Yes   Plan:  Give 4600 units bolus x 1 Start heparin infusion at 1300 units/hr Check anti-Xa level in 6 hours and daily while on heparin Continue to monitor H&H and platelets  Aneira Cavitt D 03/17/2017,5:27 PM

## 2017-03-17 NOTE — Progress Notes (Signed)
Patient came into today to be cast for [employer]. Upon recommendation of Dr. Jacqualyn Posey Patient presents with painful keratomas Goals are offload keratomas, forefoot cushioning Plan vendor Margaretmary Eddy

## 2017-03-18 ENCOUNTER — Encounter: Payer: Self-pay | Admitting: *Deleted

## 2017-03-18 ENCOUNTER — Encounter
Admission: EM | Disposition: A | Discharge status: Home / Self Care | Payer: Self-pay | Source: Home / Self Care | Attending: Internal Medicine

## 2017-03-18 DIAGNOSIS — I70222 Atherosclerosis of native arteries of extremities with rest pain, left leg: Principal | ICD-10-CM

## 2017-03-18 DIAGNOSIS — F17200 Nicotine dependence, unspecified, uncomplicated: Secondary | ICD-10-CM

## 2017-03-18 DIAGNOSIS — M79605 Pain in left leg: Secondary | ICD-10-CM

## 2017-03-18 DIAGNOSIS — F17210 Nicotine dependence, cigarettes, uncomplicated: Secondary | ICD-10-CM

## 2017-03-18 HISTORY — PX: LOWER EXTREMITY ANGIOGRAPHY: CATH118251

## 2017-03-18 LAB — CBC
HCT: 42.5 % (ref 40.0–52.0)
Hemoglobin: 14.1 g/dL (ref 13.0–18.0)
MCH: 30.5 pg (ref 26.0–34.0)
MCHC: 33.1 g/dL (ref 32.0–36.0)
MCV: 91.9 fL (ref 80.0–100.0)
PLATELETS: 157 10*3/uL (ref 150–440)
RBC: 4.63 MIL/uL (ref 4.40–5.90)
RDW: 14.1 % (ref 11.5–14.5)
WBC: 7.9 10*3/uL (ref 3.8–10.6)

## 2017-03-18 LAB — BASIC METABOLIC PANEL
Anion gap: 6 (ref 5–15)
BUN: 9 mg/dL (ref 6–20)
CO2: 24 mmol/L (ref 22–32)
CREATININE: 0.83 mg/dL (ref 0.61–1.24)
Calcium: 8.3 mg/dL — ABNORMAL LOW (ref 8.9–10.3)
Chloride: 108 mmol/L (ref 101–111)
GFR calc Af Amer: >60 mL/min (ref >60)
Glucose, Bld: 126 mg/dL — ABNORMAL HIGH (ref 65–99)
Potassium: 3.5 mmol/L (ref 3.5–5.1)
SODIUM: 138 mmol/L (ref 135–145)

## 2017-03-18 LAB — HEPARIN LEVEL (UNFRACTIONATED)
HEPARIN UNFRACTIONATED: 0.56 [IU]/mL (ref 0.30–0.70)
Heparin Unfractionated: 0.48 IU/mL (ref 0.30–0.70)

## 2017-03-18 SURGERY — LOWER EXTREMITY ANGIOGRAPHY
Anesthesia: Moderate Sedation | Laterality: Left

## 2017-03-18 MED ORDER — MIDAZOLAM HCL 2 MG/2ML IJ SOLN
INTRAMUSCULAR | Status: AC
  Filled 2017-03-18: qty 2

## 2017-03-18 MED ORDER — HEPARIN SODIUM (PORCINE) 1000 UNIT/ML IJ SOLN
INTRAMUSCULAR | Status: DC | PRN
  Administered 2017-03-18: 2000 [IU] via INTRAVENOUS
  Administered 2017-03-18: 4000 [IU] via INTRAVENOUS

## 2017-03-18 MED ORDER — TIROFIBAN HCL IV 12.5 MG/250 ML
INTRAVENOUS | Status: AC
  Filled 2017-03-18: qty 250

## 2017-03-18 MED ORDER — ADULT MULTIVITAMIN W/MINERALS CH
1.0000 | ORAL_TABLET | Freq: Every day | ORAL | Status: DC
  Administered 2017-03-18 – 2017-03-19 (×2): 1 via ORAL
  Filled 2017-03-18: qty 1

## 2017-03-18 MED ORDER — MIDAZOLAM HCL 2 MG/2ML IJ SOLN
INTRAMUSCULAR | Status: DC | PRN
  Administered 2017-03-18 (×5): 1 mg via INTRAVENOUS
  Administered 2017-03-18: 2 mg via INTRAVENOUS

## 2017-03-18 MED ORDER — DIPHENHYDRAMINE HCL 50 MG/ML IJ SOLN
INTRAMUSCULAR | Status: AC
  Filled 2017-03-18: qty 1

## 2017-03-18 MED ORDER — ALTEPLASE 2 MG IJ SOLR
INTRAMUSCULAR | Status: AC
  Filled 2017-03-18: qty 2

## 2017-03-18 MED ORDER — FENTANYL CITRATE (PF) 100 MCG/2ML IJ SOLN
INTRAMUSCULAR | Status: AC
  Filled 2017-03-18: qty 2

## 2017-03-18 MED ORDER — DIPHENHYDRAMINE HCL 50 MG/ML IJ SOLN
INTRAMUSCULAR | Status: DC | PRN
  Administered 2017-03-18: 25 mg via INTRAVENOUS

## 2017-03-18 MED ORDER — ATORVASTATIN CALCIUM 20 MG PO TABS
20.0000 mg | ORAL_TABLET | Freq: Every day | ORAL | Status: DC
  Administered 2017-03-18: 20 mg via ORAL
  Filled 2017-03-18: qty 1

## 2017-03-18 MED ORDER — FENTANYL CITRATE (PF) 100 MCG/2ML IJ SOLN
INTRAMUSCULAR | Status: DC | PRN
  Administered 2017-03-18 (×3): 25 ug via INTRAVENOUS
  Administered 2017-03-18: 50 ug via INTRAVENOUS
  Administered 2017-03-18 (×4): 25 ug via INTRAVENOUS

## 2017-03-18 MED ORDER — MIDAZOLAM HCL 5 MG/5ML IJ SOLN
INTRAMUSCULAR | Status: AC
  Filled 2017-03-18: qty 5

## 2017-03-18 MED ORDER — IOPAMIDOL (ISOVUE-300) INJECTION 61%
INTRAVENOUS | Status: DC | PRN
  Administered 2017-03-18: 130 mL via INTRAVENOUS

## 2017-03-18 MED ORDER — ALTEPLASE 2 MG IJ SOLR
INTRAMUSCULAR | Status: DC | PRN
  Administered 2017-03-18: 8 mg

## 2017-03-18 MED ORDER — HEPARIN SODIUM (PORCINE) 1000 UNIT/ML IJ SOLN
INTRAMUSCULAR | Status: AC
  Filled 2017-03-18: qty 1

## 2017-03-18 MED ORDER — ALTEPLASE 2 MG IJ SOLR
INTRAMUSCULAR | Status: AC
  Filled 2017-03-18: qty 6

## 2017-03-18 MED ORDER — LIDOCAINE-EPINEPHRINE (PF) 1 %-1:200000 IJ SOLN
INTRAMUSCULAR | Status: AC
  Filled 2017-03-18: qty 30

## 2017-03-18 MED ORDER — ASPIRIN EC 81 MG PO TBEC
81.0000 mg | DELAYED_RELEASE_TABLET | Freq: Every day | ORAL | Status: DC
  Administered 2017-03-18 – 2017-03-19 (×2): 81 mg via ORAL
  Filled 2017-03-18 (×2): qty 1

## 2017-03-18 MED ORDER — HEPARIN (PORCINE) IN NACL 2-0.9 UNIT/ML-% IJ SOLN
INTRAMUSCULAR | Status: AC
  Filled 2017-03-18: qty 1000

## 2017-03-18 MED ORDER — DEXTROSE 5 % IV SOLN
INTRAVENOUS | Status: AC
  Administered 2017-03-18: 11:00:00
  Filled 2017-03-18: qty 1.5

## 2017-03-18 MED ORDER — ENSURE ENLIVE PO LIQD
237.0000 mL | Freq: Two times a day (BID) | ORAL | Status: DC
  Administered 2017-03-18 – 2017-03-19 (×3): 237 mL via ORAL

## 2017-03-18 MED ORDER — TIROFIBAN (AGGRASTAT) BOLUS VIA INFUSION
25.0000 ug/kg | Freq: Once | INTRAVENOUS | Status: AC
  Administered 2017-03-18: 1927.5 ug via INTRAVENOUS
  Filled 2017-03-18: qty 39

## 2017-03-18 MED ORDER — SODIUM CHLORIDE 0.9 % IV SOLN: INTRAVENOUS | Status: DC

## 2017-03-18 MED ORDER — DEXTROSE 5 % IV SOLN
1.5000 g | INTRAVENOUS | Status: DC
  Filled 2017-03-18: qty 1.5

## 2017-03-18 MED ORDER — TIROFIBAN HCL IN NACL 5-0.9 MG/100ML-% IV SOLN
0.1500 ug/kg/min | INTRAVENOUS | Status: AC
  Filled 2017-03-18 (×3): qty 100

## 2017-03-18 SURGICAL SUPPLY — 36 items
BALLN LUTONIX 4X150X130 (BALLOONS) ×2
BALLN ULTRVRSE 3X150X150 (BALLOONS) ×2
BALLN ULTRVRSE 4X80X150 (BALLOONS) ×2
BALLN ULTRVRSE 4X80X150 OTW (BALLOONS) ×1
BALLN ULTRVRSE 8X100X75 (BALLOONS) ×2
BALLOON LUTONIX 4X150X130 (BALLOONS) IMPLANT
BALLOON ULTRVRSE 3X150X150 (BALLOONS) IMPLANT
BALLOON ULTRVRSE 4X80X150 OTW (BALLOONS) IMPLANT
BALLOON ULTRVRSE 8X100X75 (BALLOONS) IMPLANT
CANISTER PENUMBRA MAX (MISCELLANEOUS) ×1 IMPLANT
CATH BEACON 5 .035 65 RIM TIP (CATHETERS) ×1 IMPLANT
CATH BEACON 5 .038 100 VERT TP (CATHETERS) ×1 IMPLANT
CATH IMAGER II S 5FR 65CM (MISCELLANEOUS) ×1 IMPLANT
CATH INDIGO 6 ST-TIP 135CM (CATHETERS) ×2 IMPLANT
CATH INDIGO SEP 6 (CATHETERS) ×1 IMPLANT
CATH INFUS 90CMX20CM (CATHETERS) ×1 IMPLANT
CATH VS15FR (CATHETERS) ×1 IMPLANT
COVER PROBE U/S 5X48 (MISCELLANEOUS) ×1 IMPLANT
DEVICE PRESTO INFLATION (MISCELLANEOUS) ×1 IMPLANT
DEVICE STARCLOSE SE CLOSURE (Vascular Products) ×1 IMPLANT
GLIDEWIRE ADV .035X260CM (WIRE) ×1 IMPLANT
INTRODUCER 7FR 23CM (SHEATH) ×1 IMPLANT
PACK ANGIOGRAPHY (CUSTOM PROCEDURE TRAY) ×2 IMPLANT
SHEATH BRITE TIP 5FRX11 (SHEATH) ×1 IMPLANT
SHEATH FLEXOR ANSEL2 7FRX45 (SHEATH) ×1 IMPLANT
STENT VIABAHN 5X50X120 (Permanent Stent) ×1 IMPLANT
STENT VIABAHN 8X100X120 (Permanent Stent) ×2 IMPLANT
STENT VIABAHN 8X10X120 (Permanent Stent) IMPLANT
STENT VIABAHN VBX 7X59X80 (Permanent Stent) ×1 IMPLANT
STENT VIABAHN VBX 7X79X135 (Permanent Stent) ×1 IMPLANT
STENT VIABAHN VBX 8X59X135 (Permanent Stent) IMPLANT
TOWEL OR 17X26 4PK STRL BLUE (TOWEL DISPOSABLE) ×1 IMPLANT
TUBING ASPIRATION INDIGO (MISCELLANEOUS) ×1 IMPLANT
TUBING CONTRAST HIGH PRESS 72 (TUBING) ×1 IMPLANT
WIRE G V18X300CM (WIRE) ×2 IMPLANT
WIRE J 3MM .035X145CM (WIRE) ×1 IMPLANT

## 2017-03-18 NOTE — OR Nursing (Signed)
Patient here today for lower extremity angiogram per Dr Lucky Cowboy, attached to monitor and will follow vitals throughout entire procedure.

## 2017-03-18 NOTE — Op Note (Signed)
[name] Percutaneous Study/Intervention Procedural Note   Date of Surgery: 03/18/2017  Surgeon(s):DEW,JASON   Assistants:none  Pre-operative Diagnosis: PAD with rest Lee LLE, acute on chronic disease  Post-operative diagnosis: Same  Procedure(s) Performed: 1. Ultrasound guidance for vascular access right femoral artery 2. Catheter placement into left posterior tibial and anterior tibial arteries 3. Aortogram and selective left lower extremity angiogram 4. Catheter directed thrombolytic therapy to the left common and external iliac arteries with 8 mg of TPA 5. Mechanical thrombectomy to the left common iliac artery, external iliac artery, popliteal artery, tibioperoneal trunk, anterior tibial artery, and posterior tibial artery with the penumbra cat 6 device  6.  Viabahn covered stent placement to the left external iliac artery with 8 mm diameter by 10 cm length covered stent  7.  VBX covered stent placement to the left common iliac artery with two 7 mm diameter by 59 mm length covered stents  8.  Percutaneous transluminal angioplasty of the popliteal artery and TP trunk with 4 mm diameter by 15 cm length Lutonix drug-coated angioplasty balloon  9.  Percutaneous transluminal angioplasty of the left anterior tibial artery with 3 mm 15 cm length angioplasty balloon  10.  Percutaneous transluminal angioplasty of the left posterior tibial artery and tibioperoneal trunk with 3 mm diameter by 15 cm length angioplasty balloon  11.  Viabahn stent placement to the left popliteal artery and proximal tibia peroneal trunk with a 5 mm diameter by 5 cm covered stent for residual thrombus and stenosis after above procedures 12. StarClose closure device right femoral artery  EBL: 550 cc  Contrast: 130 cc  Fluoro Time: 20 minutes  Moderate Conscious Sedation Time: approximately 140 minutes  using 7 mg of Versed and 225 mcg of Fentanyl  Indications: Patient is a 56 y.o.male with LLE rest Lee after worsening claudication for several weeks. The patient has a CT scan showing occlusion of the left iliac arteries as well as stenosis or occlusion of the left popliteal artery and tibial vessels. The patient is brought in for angiography for further evaluation and potential treatment. Risks and benefits are discussed and informed consent is obtained  Procedure: The patient was identified and appropriate procedural time out was performed. The patient was then placed supine on the table and prepped and draped in the usual sterile fashion.Moderate conscious sedation was administered during a face to face encounter with the patient throughout the procedure with my supervision of the RN administering medicines and monitoring the patient's vital signs, pulse oximetry, telemetry and mental status throughout from the start of the procedure until the patient was taken to the recovery room. Ultrasound was used to evaluate the right common femoral artery. It was patent . A digital ultrasound image was acquired. A Seldinger needle was used to access the right common femoral artery under direct ultrasound guidance and a permanent image was performed. A 0.035 J wire was advanced without resistance and a 5Fr sheath was placed. Pigtail catheter was placed into the aorta and an AP aortogram was performed. This demonstrated that the renal arteries were widely patent.  Aorta was widely patent.  Right iliac arteries were widely patent.  The left common and external iliac arteries were occluded with reconstitution just above the femoral head and the occlusion starting about 2 cm beyond the origin of the common iliac artery.  This appeared to be a combination of stenosis and thrombosis. I then crossed the aortic bifurcation with only a mild amount of difficulty  which would also confirm the thrombotic nature  of the lesion.  This was done with a VS 1 catheter and advanced to the left femoral head. Selective left lower extremity angiogram was then performed. This demonstrated reasonably normal SFA and profunda femoris artery.  The popliteal artery was occluded with thrombus as were the origins of all 3 tibial vessels.  All 3 tibial vessels did reconstitute distally. The patient was systemically heparinized and a 90 cm total length 20 cm working length catheter for infusion was placed and 8 mg of TPA was instilled throughout the left common and external iliac arteries.  This was allowed to dwell for 20-25 minutes.  I then placed a 23 cm 7 French sheath for mechanical thrombectomy with the penumbra cat 6 device in the left common and external iliac arteries.  Following this, there was now somewhat of a channel of flow but a large amount of stenosis with what appeared to be an atherosclerotic lesion 2-3 cm beyond the origin of the left common iliac artery which may have been the cause of this thrombosis.  I elected to treat these areas with stent placement.  Over a 0.018 wire the left external iliac artery was treated with an 8 mm diameter by 10 cm length covered Viabahn stent taken down to the top of the femoral head.  For the common iliac lesions I used 2 7 mm diameter by 59 mm length and VBX balloon expandable stents taken up to be just at the origin of the left common iliac artery and overlapping into the Viabahn stent.  These were then postdilated with 7 and 8 mm balloons and ultimately an excellent angiographic completion result with no significant residual stenosis or thrombosis was identified.  I then turned my attention to the more distal disease.  A 7 [place] Ansell sheath was then placed over the [employer] wire. I then used a Kumpe catheter and the advantage wire to navigate through the popliteal occlusion and advance a wire into the posterior tibial artery and confirm intraluminal flow with a Kumpe  catheter.  I then exchanged for a 0.018 wire and proceeded with treatment.  I started by making several passes with the penumbra cat 6 device in the popliteal artery, tibioperoneal trunk, and posterior tibial arteries.  I did this over a wire and with the separator.  Minimal improvement was seen.  I then treated the popliteal artery and tibioperoneal trunk proximally with a 4 mm diameter by 15 cm length Lutonix drug-coated angioplasty balloon inflated to 12 atm for 1 minute.  The posterior tibial artery in the proximal to mid segment and the tibioperoneal trunk were then treated with a 3 mm diameter by 15 cm angioplasty balloon inflated to 10 atm for there remained flow-limiting residual thrombus throughout the popliteal artery, tibioperoneal trunk, and proximal portions of all 3 tibial vessels.  I readvanced a wire and this time the wire anterior tibial artery and so mechanical thrombectomy with the penumbra cat 6 catheter was performed in the proximal anterior tibial artery with no improvement was seen with this, angioplasty was performed with a 3 mm diameter by 15 cm length angioplasty balloon in the anterior tibial artery related to 12 atm for 1 minute.  There remained a flow limiting blob of thrombus now essentially at the TP trunk and distal popliteal artery.  At this point, if the peroneal artery was continuous without thrombus seen popliteal artery and proximal TP trunk had a more focal appearing thrombus over a  few centimeters.  This occluded the origin of the anterior tibial artery although the more distal anterior tibial artery seem to normalize.  There was some thrombus down the mid to distal posterior tibial artery although this appeared only mildly flow limiting.  The blob of thrombus in the distal popliteal artery was nearly occlusive and I felt that we would have to do something further.  Another pass with the penumbra catheter showed minimal improvement.  I felt we had no other option than to place  a covered stent in this area and have the peroneal and tibioperoneal trunk is runoff essentially occluding the anterior tibial artery proximally.  A 5 mm diameter by 5 cm length Viabahn covered stent was then deployed in the distal popliteal artery and proximal tibioperoneal trunk.  This was postdilated with a 4 mm balloon with excellent angiographic completion result in less than 10% residual stenosis.  There now was continuous flow best through the peroneal artery with some additional flow seen in the posterior tibial artery although residual thrombus was still present in the posterior tibial artery.  I felt we had done as much as possible and we treat the patient with intravenous anticoagulants to try to maximize the tibial runoff and maintain the patency of all of our interventions. I elected to terminate the procedure. The sheath was removed and StarClose closure device was deployed in the right femoral artery with excellent hemostatic result. The patient was taken to the recovery room in stable condition having tolerated the procedure well.  Findings:  Aortogram: renal arteries were widely patent.  Aorta was widely patent.  Right iliac arteries were widely patent.  The left common and external iliac arteries were occluded with reconstitution just above the femoral head and the occlusion starting about 2 cm beyond the origin of the common iliac artery.  This appeared to be a combination of stenosis and thrombosis. Left lower Extremity: reasonably normal SFA and profunda femoris artery.  The popliteal artery was occluded with thrombus as were the origins of all 3 tibial vessels.  All 3 tibial vessels did reconstitute distally   Disposition: Patient was taken to the recovery room in stable condition having tolerated the procedure well.  Complications: None  Leotis Lee 03/18/2017 12:51 PM   This note was created with Dragon Medical transcription system. Any errors in  dictation are purely unintentional.

## 2017-03-18 NOTE — Progress Notes (Signed)
ANTICOAGULATION CONSULT NOTE - Initial Consult  Pharmacy Consult for Heparin  Indication: DVT  No Known Allergies  Patient Measurements: Height: 6' (182.9 cm) Weight: 170 lb (77.1 kg) IBW/kg (Calculated) : 77.6 Heparin Dosing Weight: 77.1 kg   Vital Signs: Temp: 98.2 F (36.8 C) (11/09 0606) Temp Source: Oral (11/09 0606) BP: 128/70 (11/09 0606) Pulse Rate: 57 (11/09 0606)  Labs: Recent Labs    03/17/17 1613 03/18/17 0011 03/18/17 0608  HGB 16.6 14.1  --   HCT 50.9 42.5  --   PLT 169 157  --   APTT 32  --   --   LABPROT 12.4  --   --   INR 0.93  --   --   HEPARINUNFRC  --  0.56 0.48  CREATININE 0.91 0.83  --     Estimated Creatinine Clearance: 109.7 mL/min (by C-G formula based on SCr of 0.83 mg/dL).   Medical History: Past Medical History:  Diagnosis Date  . Hyperlipidemia     Medications:  Medications Prior to Admission  Medication Sig Dispense Refill Last Dose  . Urea 39 % CREA Apply 1 application topically daily. 1 Bottle 0 03/17/2017 at Unknown time    Assessment: Pharmacy consulted to dose heparin in this 56 year old male admitted with VTE.  No prior anticoag noted. CrCl = 100 ml/min  Goal of Therapy:  Heparin level 0.3-0.7 units/ml Monitor platelets by anticoagulation protocol: Yes   Plan:  Give 4600 units bolus x 1 Start heparin infusion at 1300 units/hr Check anti-Xa level in 6 hours and daily while on heparin Continue to monitor H&H and platelets   11/9 @ 0600 HL 0.48 therapeutic. Will continue current rate and will recheck w/ am labs and CBC.  Tobie Lords, PharmD, BCPS Clinical Pharmacist 03/18/2017

## 2017-03-18 NOTE — Progress Notes (Signed)
Initial Nutrition Assessment  DOCUMENTATION CODES:   Not applicable  INTERVENTION:   Ensure Enlive po BID, each supplement provides 350 kcal and 20 grams of protein  MVI  NUTRITION DIAGNOSIS:   Inadequate oral intake related to acute illness(unknown ) as evidenced by NPO status.  GOAL:   Patient will meet greater than or equal to 90% of their needs  MONITOR:   Diet advancement, Labs, Weight trends, I & O's  REASON FOR ASSESSMENT:   Malnutrition Screening Tool    ASSESSMENT:    56 y.o. male with a known history of tobacco abuse who presents to the hospital due to left lower extremity pain. Left lower extremity ischemia.  Likely acute on chronic with thrombosis of the left iliac lesion.    Unable to see pt today; pt having angiogram at time of RD visit. No recent wt history in chart. RD will obtain nutrition related history and nutrition focused physical exam at follow up. Will order Ensure and MVI to help pt meet his estimated needs.   Medications reviewed and include: aspirin, heparin, fentanyl   Labs reviewed: Ca 8.3(L)  Unable to complete Nutrition-Focused physical exam at this time.   Diet Order:  Diet Heart Room service appropriate? Yes; Fluid consistency: Thin  EDUCATION NEEDS:   Not appropriate for education at this time  Skin:  Reviewed RN Assessment  Last BM:  11/8  Height:   Ht Readings from Last 1 Encounters:  03/18/17 6' (1.829 m)    Weight:   Wt Readings from Last 1 Encounters:  03/18/17 170 lb (77.1 kg)    Ideal Body Weight:  80.9 kg  BMI:  Body mass index is 23.06 kg/m.  Estimated Nutritional Needs:   Kcal:  2100-2400kcal/day   Protein:  85-100g/day   Fluid:  >2L/day   Koleen Distance MS, RD, LDN Pager #[phone] After Hours Pager: [phone]

## 2017-03-18 NOTE — Progress Notes (Signed)
Orders for pt to be transferred to 2A

## 2017-03-18 NOTE — Consult Note (Signed)
[facility] Vascular Consult Note  MRN : [id number]  Barry Lee is a 56 y.o. (01-Oct-1960) male who presents with chief complaint of  Chief Complaint  Patient presents with  . Leg Pain  .  History of Present Illness: I am asked by Dr. Quentin Cornwall in the emergency department to see the patient regarding left lower extremity ischemia.  The patient reports a couple of weeks of claudication type symptoms which have become disabling and over the past 2-3 days have turned into rest pain in the left foot.  His pain hurts severely and basically all the time.  Dangling his foot helps a little bit.  The blood thinners he has been placed on have helped a little bit.  Prior to this, he did describe some cramping in his left leg with activity.  No ulceration or infection.  No fever or chills.  No chest pain or shortness of breath.  The patient underwent a CT angiogram which I have independently reviewed.  This demonstrates occlusion of the left common and external iliac arteries with reconstitution of the left femoral artery.  There is also a lesion in the popliteal artery that could certainly be thrombus or embolic although native disease could be present as well.  The right lower extremity seems to have pretty good perfusion without any obvious severe disease.  Current Facility-Administered Medications  Medication Dose Route Frequency Provider Last Rate Last Dose  . 0.9 %  sodium chloride infusion   Intravenous Continuous Almeter Westhoff, Erskine Squibb, MD      . acetaminophen (TYLENOL) tablet 650 mg  650 mg Oral Q6H PRN Henreitta Leber, MD       Or  . acetaminophen (TYLENOL) suppository 650 mg  650 mg Rectal Q6H PRN Henreitta Leber, MD      . cefUROXime (ZINACEF) 1.5 g in dextrose 5 % 50 mL IVPB  1.5 g Intravenous 30 min Pre-Op Algernon Huxley, MD      . dextrose 5 % with cefUROXime (ZINACEF) ADS Med           . heparin ADULT infusion 100 units/mL (25000 units/234mL sodium chloride 0.45%)  1,300  Units/hr Intravenous Continuous Merlyn Lot, MD 13 mL/hr at 03/17/17 1814 1,300 Units/hr at 03/17/17 1814  . HYDROcodone-acetaminophen (NORCO/VICODIN) 5-325 MG per tablet 1-2 tablet  1-2 tablet Oral Q4H PRN Henreitta Leber, MD      . morphine 2 MG/ML injection 2 mg  2 mg Intravenous Q4H PRN Sainani, Belia Heman, MD      . ondansetron (ZOFRAN) tablet 4 mg  4 mg Oral Q6H PRN Henreitta Leber, MD       Or  . ondansetron (ZOFRAN) injection 4 mg  4 mg Intravenous Q6H PRN Henreitta Leber, MD        Past Medical History:  Diagnosis Date  . Hyperlipidemia     History reviewed. No pertinent surgical history.  Social History Social History   Tobacco Use  . Smoking status: Current Every Day Smoker    Packs/day: 0.75    Years: 30.00    Pack years: 22.50    Types: Cigars, Cigarettes  . Smokeless tobacco: Never Used  Substance Use Topics  . Alcohol use: No    Comment: rare  . Drug use: No    Family History Family History  Problem Relation Age of Onset  . Diabetes Mother   . Alzheimer's disease Father   no bleeding disorders, clotting disorders, or aneurysms  No Known Allergies   REVIEW OF SYSTEMS (Negative unless checked)  Constitutional: [] Weight loss  [] Fever  [] Chills Cardiac: [] Chest pain   [] Chest pressure   [] Palpitations   [] Shortness of breath when laying flat   [] Shortness of breath at rest   [] Shortness of breath with exertion. Vascular:  [] Pain in legs with walking   [] Pain in legs at rest   [] Pain in legs when laying flat   [x] Claudication   [x] Pain in feet when walking  [x] Pain in feet at rest  [] Pain in feet when laying flat   [] History of DVT   [] Phlebitis   [] Swelling in legs   [] Varicose veins   [] Non-healing ulcers Pulmonary:   [] Uses home oxygen   [] Productive cough   [] Hemoptysis   [] Wheeze  [] COPD   [] Asthma Neurologic:  [] Dizziness  [] Blackouts   [] Seizures   [] History of stroke   [] History of TIA  [] Aphasia   [] Temporary blindness   [] Dysphagia   [] Weakness  or numbness in arms   [] Weakness or numbness in legs Musculoskeletal:  [x] Arthritis   [] Joint swelling   [] Joint pain   [] Low back pain Hematologic:  [] Easy bruising  [] Easy bleeding   [] Hypercoagulable state   [] Anemic  [] Hepatitis Gastrointestinal:  [] Blood in stool   [] Vomiting blood  [x] Gastroesophageal reflux/heartburn   [] Difficulty swallowing. Genitourinary:  [] Chronic kidney disease   [] Difficult urination  [] Frequent urination  [] Burning with urination   [] Blood in urine Skin:  [] Rashes   [] Ulcers   [] Wounds Psychological:  [] History of anxiety   []  History of major depression.  Physical Examination  Vitals:   03/17/17 2053 03/18/17 0606 03/18/17 0758 03/18/17 0841  BP: 130/73 128/70 124/74 125/78  Pulse: 61 (!) 57 (!) 50 (!) 53  Resp: 18 16 19 16   Temp: 98.3 F (36.8 C) 98.2 F (36.8 C)  98.1 F (36.7 C)  TempSrc: Oral Oral  Oral  SpO2: 100% 99% 100% 99%  Weight:    77.1 kg (170 lb)  Height:    6' (1.829 m)   Body mass index is 23.06 kg/m. Gen:  WD/WN, NAD Head: Carlos/AT, No temporalis wasting.  Ear/Nose/Throat: Hearing grossly intact, nares w/o erythema or drainage, oropharynx w/o Erythema/Exudate Eyes: Sclera non-icteric, conjunctiva clear Neck: Trachea midline.  No JVD.  Pulmonary:  Good air movement, respirations not labored, equal bilaterally.  Cardiac: RRR, normal S1, S2. Vascular:  Vessel Right Left  Radial Palpable Palpable                  Femoral Palpable  not palpable  Popliteal Palpable  not palpable  PT Palpable  trace palpable  DP Palpable  not palpable   Musculoskeletal: M/S 5/5 throughout.  Extremities without ischemic changes.  No deformity or atrophy. No edema. Neurologic: Sensation grossly intact in extremities.  Symmetrical.  Speech is fluent. Motor exam as listed above. Psychiatric: Judgment intact, Mood & affect appropriate for pt's clinical situation. Dermatologic: No rashes or ulcers noted.  No cellulitis or open  wounds.       CBC Lab Results  Component Value Date   WBC 7.9 03/18/2017   HGB 14.1 03/18/2017   HCT 42.5 03/18/2017   MCV 91.9 03/18/2017   PLT 157 03/18/2017    BMET    Component Value Date/Time   NA 138 03/18/2017 0011   NA 139 07/06/2013 1525   K 3.5 03/18/2017 0011   K 4.1 07/06/2013 1525   CL 108 03/18/2017 0011   CL 103 07/06/2013 1525  CO2 24 03/18/2017 0011   CO2 29 07/06/2013 1525   GLUCOSE 126 (H) 03/18/2017 0011   GLUCOSE 95 07/06/2013 1525   BUN 9 03/18/2017 0011   BUN 13 07/06/2013 1525   CREATININE 0.83 03/18/2017 0011   CREATININE 1.18 07/06/2013 1525   CALCIUM 8.3 (L) 03/18/2017 0011   CALCIUM 8.7 07/06/2013 1525   GFRNONAA >60 03/18/2017 0011   GFRNONAA >60 07/06/2013 1525   GFRAA >60 03/18/2017 0011   GFRAA >60 07/06/2013 1525   Estimated Creatinine Clearance: 109.7 mL/min (by C-G formula based on SCr of 0.83 mg/dL).  COAG Lab Results  Component Value Date   INR 0.93 03/17/2017    Radiology Ct Angio Aortobifemoral W And/or Wo Contrast  Result Date: 03/17/2017 CLINICAL DATA:  Severe left foot pain this morning. Intermittent Doppler pulses. EXAM: CT ANGIOGRAPHY AOBIFEM WITHOUT AND WITH CONTRAST<Procedure Description>CT ANGIOGRAPHY AOBIFEM WITHOUT AND WITH CONTRAST TECHNIQUE: Axial images were obtained from the level of the lower chest through the feet after administration of IV contrast material. Coronal and sagittal reconstructions were performed. CONTRAST:  148mL ISOVUE-370 IOPAMIDOL (ISOVUE-370) INJECTION 76%<Contrast>155mL ISOVUE-370 IOPAMIDOL (ISOVUE-370) INJECTION 76% COMPARISON:  None. FINDINGS: Scattered mild atherosclerosis of the normal caliber abdominal aorta. Normal flow within the celiac, superior mesenteric and inferior mesenteric artery branches. Complete occlusion within the proximal portion of the left common iliac artery. No flow seen within the left external iliac artery. There is reconstitution of flow within the left internal  iliac artery and at the left femoral artery via collaterals. Normal flow is seen throughout the left SFA and profundus arteries. Normal flow is seen within the left popliteal artery. Partially occlusive thrombus noted at the junction of the left anterior tibial artery and tibioperoneal trunk. This is reflected by diminished flow in the anterior tibial artery and no appreciable flow in the anterior tibial artery just above the level of the left ankle. Some flow is seen within the left peroneal and posterior tibial arteries. There is normal flow throughout the right lower extremity. Liver, gallbladder, pancreas, and spleen appear normal. Adrenal glands and kidneys are unremarkable. Small right renal cyst. Incidental note made of a retroaortic left renal vein. No dilated large or small bowel loops. No evidence of bowel wall thickening or bowel wall inflammation seen. No free fluid or abscess collection within the abdomen or pelvis. No free intraperitoneal air seen. Osseous structures are unremarkable. Lung bases are clear. Review of the MIP images confirms the above findings. IMPRESSION: 1. 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 There is associated diminished flow within the left anterior tibial artery with no appreciable flow in the left anterior tibia artery just above the level of the left ankle. Grossly normal contrast flow is seen within the left peroneal and posterior tibial arteries to the level of the ankle. 2. 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 Reconstitution of flow within the left femoral artery via collaterals with normal contrast flow seen through the left SFA and left popliteal artery. There is also reconstitution of flow in the left internal iliac artery via  collaterals. 3. Normal contrast flow throughout the right lower extremity arteries. 4. No acute appearing findings within the abdomen or pelvis. No evidence of neoplastic process seen. These results were called by telephone at the time of interpretation  on 03/17/2017 at 6:30 pm to Dr. Merlyn Lot , who verbally acknowledged these results. Electronically Signed   By: Franki Cabot M.D.   On: 03/17/2017 18:34   Dg Chest Portable 1 View  Result Date: 03/17/2017 CLINICAL DATA:  Cough tobacco use.  Lower extremity claudication EXAM: PORTABLE CHEST 1 VIEW COMPARISON:  None. FINDINGS: The lungs are clear. Heart size and pulmonary vascularity are normal. No adenopathy. No bone lesions. Trachea appears normal. IMPRESSION: No edema or consolidation. Electronically Signed   By: Lowella Grip III M.D.   On: 03/17/2017 16:32      Assessment/Plan 1.  Left lower extremity ischemia.  Likely acute on chronic with thrombosis of the left iliac lesion.  CT angiogram reviewed.  The patient has rest pain which is a critical and limb threatening situation.  He has been appropriately started on heparin which has helped some, but he continues to have pain.  We will proceed with angiogram urgently this morning.  If revascularization is an option in an endovascular fashion, this will be performed.  If open surgical therapy is required this would be considered as well.  The patient understands the grave nature of the situation and we will do our best to perform revascularization and return his functional status.   2.  Hyperlipidemia.  Needs to be on a statin agent.  This is clearly an underlying cause of his vascular disease 3.  Tobacco dependence.  Also clearly an underlying cause of his vascular disease.  Tobacco cessation recommended both for reduction of ongoing vascular disease as well as patency of any intervention.   Leotis Pain, MD  03/18/2017 10:21 AM    This note was created with Dragon medical  transcription system.  Any error is purely unintentional

## 2017-03-19 ENCOUNTER — Inpatient Hospital Stay
Admit: 2017-03-19 | Discharge: 2017-03-19 | Disposition: A | Discharge status: Home / Self Care | Payer: 59 | Attending: Internal Medicine | Admitting: Internal Medicine

## 2017-03-19 LAB — ECHOCARDIOGRAM COMPLETE
Area-P 1/2: 4.58 cm2
CHL CUP MV DEC (S): 165
E/e' ratio: 4.53
EWDT: 165 ms
FS: 46 % — AB (ref 28–44)
HEIGHTINCHES: 72 in
IV/PV OW: 0.65
LA diam end sys: 35 mm
LA diam index: 1.77 cm/m2
LA vol A4C: 29.7 ml
LA vol: 40.9 mL
LASIZE: 35 mm
LAVOLIN: 20.7 mL/m2
LV E/e' medial: 4.53
LV e' LATERAL: 16.6 cm/s
LVEEAVG: 4.53
Lateral S' vel: 12.6 cm/s
MV Peak grad: 2 mmHg
MV pk A vel: 51.9 m/s
MV pk E vel: 75.2 m/s
MVSPHT: 48 ms
PW: 11.8 mm — AB (ref 0.6–1.1)
TAPSE: 23 mm
TDI e' lateral: 16.6
TDI e' medial: 11.2
Weight: 2720 oz

## 2017-03-19 LAB — CBC
HCT: 42.3 % (ref 40.0–52.0)
HEMOGLOBIN: 13.9 g/dL (ref 13.0–18.0)
MCH: 30.3 pg (ref 26.0–34.0)
MCHC: 32.8 g/dL (ref 32.0–36.0)
MCV: 92.3 fL (ref 80.0–100.0)
PLATELETS: 157 10*3/uL (ref 150–440)
RBC: 4.58 MIL/uL (ref 4.40–5.90)
RDW: 14.1 % (ref 11.5–14.5)
WBC: 8.4 10*3/uL (ref 3.8–10.6)

## 2017-03-19 LAB — BASIC METABOLIC PANEL
Anion gap: 7 (ref 5–15)
BUN: 11 mg/dL (ref 6–20)
CALCIUM: 8.6 mg/dL — AB (ref 8.9–10.3)
CO2: 25 mmol/L (ref 22–32)
CREATININE: 0.83 mg/dL (ref 0.61–1.24)
Chloride: 104 mmol/L (ref 101–111)
GFR calc Af Amer: >60 mL/min (ref >60)
GFR calc non Af Amer: >60 mL/min (ref >60)
GLUCOSE: 100 mg/dL — AB (ref 65–99)
Potassium: 4.1 mmol/L (ref 3.5–5.1)
Sodium: 136 mmol/L (ref 135–145)

## 2017-03-19 LAB — HIV ANTIBODY (ROUTINE TESTING W REFLEX): HIV SCREEN 4TH GENERATION: NONREACTIVE

## 2017-03-19 MED ORDER — ACETAMINOPHEN 325 MG PO TABS: 650.0000 mg | ORAL_TABLET | Freq: Four times a day (QID) | ORAL | Status: AC | PRN

## 2017-03-19 MED ORDER — HYDROCODONE-ACETAMINOPHEN 5-325 MG PO TABS: 1.0000 | ORAL_TABLET | Freq: Four times a day (QID) | ORAL | 0 refills | Status: AC | PRN

## 2017-03-19 MED ORDER — SODIUM CHLORIDE 0.9% FLUSH
3.0000 mL | Freq: Two times a day (BID) | INTRAVENOUS | Status: DC
  Administered 2017-03-19: 3 mL via INTRAVENOUS

## 2017-03-19 MED ORDER — ENSURE ENLIVE PO LIQD: 237.0000 mL | Freq: Two times a day (BID) | ORAL | 0 refills | Status: AC

## 2017-03-19 MED ORDER — ATORVASTATIN CALCIUM 20 MG PO TABS: 20.0000 mg | ORAL_TABLET | Freq: Every day | ORAL | 0 refills | Status: DC

## 2017-03-19 MED ORDER — CLOPIDOGREL BISULFATE 75 MG PO TABS
75.0000 mg | ORAL_TABLET | Freq: Every day | ORAL | Status: DC
  Administered 2017-03-19: 75 mg via ORAL
  Filled 2017-03-19: qty 1

## 2017-03-19 MED ORDER — ASPIRIN 81 MG PO TBEC: 81.0000 mg | DELAYED_RELEASE_TABLET | Freq: Every day | ORAL | 0 refills | Status: DC

## 2017-03-19 MED ORDER — CLOPIDOGREL BISULFATE 75 MG PO TABS: 75.0000 mg | ORAL_TABLET | Freq: Every day | ORAL | 0 refills | Status: DC

## 2017-03-19 NOTE — Progress Notes (Signed)
1 Day Post-Op   Subjective/Chief Complaint: Doing OK. Denies pain although he has not yet ambulated. Otherwise without complaint.   Objective: Vital signs in last 24 hours: Temp:  [98.6 F (37 C)-99.4 F (37.4 C)] 98.9 F (37.2 C) (11/10 0453) Pulse Rate:  [51-64] 61 (11/10 0954) Resp:  [11-18] 18 (11/10 0453) BP: (123-172)/(64-84) 132/68 (11/10 0954) SpO2:  [96 %-100 %] 98 % (11/10 0954) Last BM Date: 03/17/17  Intake/Output from previous day: 11/09 0701 - 11/10 0700 In: 492.4 [P.O.:240; I.V.:252.4] Out: 1700 [Urine:1700] Intake/Output this shift: Total I/O In: 240 [P.O.:240] Out: 300 [Urine:300]  General appearance: alert and no distress Extremities: warm, well perfused, no edema, +motor/+sensory, groin site- soft, no hematoma Pulses: Right: +DP, Left: Biphasic PT  Lab Results:  Recent Labs    03/18/17 0011 03/19/17 0459  WBC 7.9 8.4  HGB 14.1 13.9  HCT 42.5 42.3  PLT 157 157   BMET Recent Labs    03/18/17 0011 03/19/17 0502  NA 138 136  K 3.5 4.1  CL 108 104  CO2 24 25  GLUCOSE 126* 100*  BUN 9 11  CREATININE 0.83 0.83  CALCIUM 8.3* 8.6*   PT/INR Recent Labs    03/17/17 1613  LABPROT 12.4  INR 0.93   ABG No results for input(s): PHART, HCO3 in the last 72 hours.  Invalid input(s): PCO2, PO2  Studies/Results: Ct Angio Aortobifemoral W And/or Wo Contrast  Result Date: 03/17/2017 CLINICAL DATA:  Severe left foot pain this morning. Intermittent Doppler pulses. EXAM: CT ANGIOGRAPHY AOBIFEM WITHOUT AND WITH CONTRAST<Procedure Description>CT ANGIOGRAPHY AOBIFEM WITHOUT AND WITH CONTRAST TECHNIQUE: Axial images were obtained from the level of the lower chest through the feet after administration of IV contrast material. Coronal and sagittal reconstructions were performed. CONTRAST:  119mL ISOVUE-370 IOPAMIDOL (ISOVUE-370) INJECTION 76%<Contrast>138mL ISOVUE-370 IOPAMIDOL (ISOVUE-370) INJECTION 76% COMPARISON:  None. FINDINGS: Scattered mild  atherosclerosis of the normal caliber abdominal aorta. Normal flow within the celiac, superior mesenteric and inferior mesenteric artery branches. Complete occlusion within the proximal portion of the left common iliac artery. No flow seen within the left external iliac artery. There is reconstitution of flow within the left internal iliac artery and at the left femoral artery via collaterals. Normal flow is seen throughout the left SFA and profundus arteries. Normal flow is seen within the left popliteal artery. Partially occlusive thrombus noted at the junction of the left anterior tibial artery and tibioperoneal trunk. This is reflected by diminished flow in the anterior tibial artery and no appreciable flow in the anterior tibial artery just above the level of the left ankle. Some flow is seen within the left peroneal and posterior tibial arteries. There is normal flow throughout the right lower extremity. Liver, gallbladder, pancreas, and spleen appear normal. Adrenal glands and kidneys are unremarkable. Small right renal cyst. Incidental note made of a retroaortic left renal vein. No dilated large or small bowel loops. No evidence of bowel wall thickening or bowel wall inflammation seen. No free fluid or abscess collection within the abdomen or pelvis. No free intraperitoneal air seen. Osseous structures are unremarkable. Lung bases are clear. Review of the MIP images confirms the above findings. IMPRESSION: 1. 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 There is associated diminished flow within the left anterior tibial artery with no appreciable flow in the left anterior tibia artery just above  the level of the left ankle. Grossly normal contrast flow is seen within the left peroneal and posterior tibial arteries to the level of the ankle. 2. Complete  occlusion within the proximal portion of the left common iliac artery, of uncertain age but most likely chronic. Reconstitution of flow within the left femoral artery via collaterals with normal contrast flow seen through the left SFA and left popliteal artery. There is also reconstitution of flow in the left internal iliac artery via collaterals. 3. Normal contrast flow throughout the right lower extremity arteries. 4. No acute appearing findings within the abdomen or pelvis. No evidence of neoplastic process seen. These results were called by telephone at the time of interpretation on 03/17/2017 at 6:30 pm to Dr. Merlyn Lot , who verbally acknowledged these results. Electronically Signed   By: Franki Cabot M.D.   On: 03/17/2017 18:34   Dg Chest Portable 1 View  Result Date: 03/17/2017 CLINICAL DATA:  Cough tobacco use.  Lower extremity claudication EXAM: PORTABLE CHEST 1 VIEW COMPARISON:  None. FINDINGS: The lungs are clear. Heart size and pulmonary vascularity are normal. No adenopathy. No bone lesions. Trachea appears normal. IMPRESSION: No edema or consolidation. Electronically Signed   By: Lowella Grip III M.D.   On: 03/17/2017 16:32    Anti-infectives: Anti-infectives (From admission, onward)   Start     Dose/Rate Route Frequency Ordered Stop   03/18/17 0849  dextrose 5 % with cefUROXime (ZINACEF) ADS Med    Comments:  Maynor, Erin   : cabinet override      03/18/17 0849 03/18/17 1032   03/18/17 0821  cefUROXime (ZINACEF) 1.5 g in dextrose 5 % 50 mL IVPB  Status:  Discontinued     1.5 g 100 mL/hr over 30 Minutes Intravenous 30 min pre-op 03/18/17 3545 03/18/17 1307      Assessment/Plan: s/p Procedure(s): Lower Extremity Angiography (Left) Plavix/ ASA   OOB ambulate OK from Vascular standpoint to D/C home Will need light duty for work until follow up Follow Up with Dr. Lucky Cowboy in 2 weeks.  LOS: 2 days    Jamesetta So A 03/19/2017

## 2017-03-19 NOTE — Discharge Instructions (Signed)
Femoral Site Care Refer to this sheet in the next few weeks. These instructions provide you with information about caring for yourself after your procedure. Your health care provider may also give you more specific instructions. Your treatment has been planned according to current medical practices, but problems sometimes occur. Call your health care provider if you have any problems or questions after your procedure. What can I expect after the procedure? After your procedure, it is typical to have the following:  Bruising at the site that usually fades within 1-2 weeks.  Blood collecting in the tissue (hematoma) that may be painful to the touch. It should usually decrease in size and tenderness within 1-2 weeks.  Follow these instructions at home:  Take medicines only as directed by your health care provider.  You may shower 24-48 hours after the procedure or as directed by your health care provider. Remove the bandage (dressing) and gently wash the site with plain soap and water. Pat the area dry with a clean towel. Do not rub the site, because this may cause bleeding.  Do not take baths, swim, or use a hot tub until your health care provider approves.  Check your insertion site every day for redness, swelling, or drainage.  Do not apply powder or lotion to the site.  Limit use of stairs to twice a day for the first 2-3 days or as directed by your health care provider.  Do not squat for the first 2-3 days or as directed by your health care provider.  Do not lift over 10 lb (4.5 kg) for 5 days after your procedure or as directed by your health care provider.  Ask your health care provider when it is okay to: ? Return to work or school. ? Resume usual physical activities or sports. ? Resume sexual activity.  Do not drive home if you are discharged the same day as the procedure. Have someone else drive you.  You may drive 24 hours after the procedure unless otherwise instructed by  your health care provider.  Do not operate machinery or power tools for 24 hours after the procedure or as directed by your health care provider.  If your procedure was done as an outpatient procedure, which means that you went home the same day as your procedure, a responsible adult should be with you for the first 24 hours after you arrive home.  Keep all follow-up visits as directed by your health care provider. This is important. Contact a health care provider if:  You have a fever.  You have chills.  You have increased bleeding from the site. Hold pressure on the site. Get help right away if:  You have unusual pain at the site.  You have redness, warmth, or swelling at the site.  You have drainage (other than a small amount of blood on the dressing) from the site.  The site is bleeding, and the bleeding does not stop after 30 minutes of holding steady pressure on the site.  Your leg or foot becomes pale, cool, tingly, or numb. This information is not intended to replace advice given to you by your health care provider. Make sure you discuss any questions you have with your health care provider. Document Released: 12/28/2013 Document Revised: 10/02/2015 Document Reviewed: 11/13/2013 Elsevier Interactive Patient Education  2018 [employer]. Acetaminophen; Hydrocodone tablets or capsules What is this medicine? ACETAMINOPHEN; HYDROCODONE (a set a MEE noe fen; hye droe KOE done) is a pain reliever. It is used  to treat moderate to severe pain. This medicine may be used for other purposes; ask your health care provider or pharmacist if you have questions. COMMON BRAND NAME(S): Anexsia, Bancap HC, Ceta-Plus, Co-Gesic, Comfortpak, Dolagesic, [employer], DuoCet, Hydrocet, Hydrogesic, [place], Lorcet HD, Lorcet Plus, Lortab, Margesic H, Maxidone, [place], Polygesic, [place], [place], [place], Vicodin, Vicodin ES, Vicodin HP, Charlane Ferretti What should I tell my health care provider  before I take this medicine? They need to know if you have any of these conditions: -brain tumor -Crohn's disease, inflammatory bowel disease, or ulcerative colitis -drug abuse or addiction -head injury -heart or circulation problems -if you often drink alcohol -kidney disease or problems going to the bathroom -liver disease -lung disease, asthma, or breathing problems -an unusual or allergic reaction to acetaminophen, hydrocodone, other opioid analgesics, other medicines, foods, dyes, or preservatives -pregnant or trying to get pregnant -breast-feeding How should I use this medicine? Take this medicine by mouth with a glass of water. Follow the directions on the prescription label. You can take it with or without food. If it upsets your stomach, take it with food. Do not take your medicine more often than directed. A special MedGuide will be given to you by the pharmacist with each prescription and refill. Be sure to read this information carefully each time. Talk to your pediatrician regarding the use of this medicine in children. Special care may be needed. Overdosage: If you think you have taken too much of this medicine contact a poison control center or emergency room at once. NOTE: This medicine is only for you. Do not share this medicine with others. What if I miss a dose? If you miss a dose, take it as soon as you can. If it is almost time for your next dose, take only that dose. Do not take double or extra doses. What may interact with this medicine? This medicine may interact with the following medications: -alcohol -antiviral medicines for HIV or AIDS -atropine -antihistamines for allergy, cough and cold -certain antibiotics like erythromycin, clarithromycin -certain medicines for anxiety or sleep -certain medicines for bladder problems like oxybutynin, tolterodine -certain medicines for depression like amitriptyline, fluoxetine, sertraline -certain medicines for fungal  infections like ketoconazole and itraconazole -certain medicines for Parkinson's disease like benztropine, trihexyphenidyl -certain medicines for seizures like carbamazepine, phenobarbital, phenytoin, primidone -certain medicines for stomach problems like dicyclomine, hyoscyamine -certain medicines for travel sickness like scopolamine -general anesthetics like halothane, isoflurane, methoxyflurane, propofol -ipratropium -local anesthetics like lidocaine, pramoxine, tetracaine -MAOIs like Carbex, Eldepryl, Marplan, Nardil, and Parnate -medicines that relax muscles for surgery -other medicines with acetaminophen -other narcotic medicines for pain or cough -phenothiazines like chlorpromazine, mesoridazine, prochlorperazine, thioridazine -rifampin This list may not describe all possible interactions. Give your health care provider a list of all the medicines, herbs, non-prescription drugs, or dietary supplements you use. Also tell them if you smoke, drink alcohol, or use illegal drugs. Some items may interact with your medicine. What should I watch for while using this medicine? Tell your doctor or health care professional if your pain does not go away, if it gets worse, or if you have new or a different type of pain. You may develop tolerance to the medicine. Tolerance means that you will need a higher dose of the medicine for pain relief. Tolerance is normal and is expected if you take the medicine for a long time. Do not suddenly stop taking your medicine because you may develop a severe reaction. Your body becomes used to the  medicine. This does NOT mean you are addicted. Addiction is a behavior related to getting and using a drug for a non-medical reason. If you have pain, you have a medical reason to take pain medicine. Your doctor will tell you how much medicine to take. If your doctor wants you to stop the medicine, the dose will be slowly lowered over time to avoid any side effects. There are  different types of narcotic medicines (opiates). If you take more than one type at the same time or if you are taking another medicine that also causes drowsiness, you may have more side effects. Give your health care provider a list of all medicines you use. Your doctor will tell you how much medicine to take. Do not take more medicine than directed. Call emergency for help if you have problems breathing or unusual sleepiness. Do not take other medicines that contain acetaminophen with this medicine. Always read labels carefully. If you have questions, ask your doctor or pharmacist. If you take too much acetaminophen get medical help right away. Too much acetaminophen can be very dangerous and cause liver damage. Even if you do not have symptoms, it is important to get help right away. You may get drowsy or dizzy. Do not drive, use machinery, or do anything that needs mental alertness until you know how this medicine affects you. Do not stand or sit up quickly, especially if you are an older patient. This reduces the risk of dizzy or fainting spells. Alcohol may interfere with the effect of this medicine. Avoid alcoholic drinks. The medicine will cause constipation. Try to have a bowel movement at least every 2 to 3 days. If you do not have a bowel movement for 3 days, call your doctor or health care professional. Your mouth may get dry. Chewing sugarless gum or sucking hard candy, and drinking plenty of water may help. Contact your doctor if the problem does not go away or is severe. What side effects may I notice from receiving this medicine? Side effects that you should report to your doctor or health care professional as soon as possible: -allergic reactions like skin rash, itching or hives, swelling of the face, lips, or tongue -breathing problems -confusion -redness, blistering, peeling or loosening of the skin, including inside the mouth -signs and symptoms of low blood pressure like dizziness;  feeling faint or lightheaded, falls; unusually weak or tired -trouble passing urine or change in the amount of urine -yellowing of the eyes or skin Side effects that usually do not require medical attention (report to your doctor or health care professional if they continue or are bothersome): -constipation -dry mouth -nausea, vomiting -tiredness This list may not describe all possible side effects. Call your doctor for medical advice about side effects. You may report side effects to FDA at 1-800-FDA-1088. Where should I keep my medicine? Keep out of the reach of children. This medicine can be abused. Keep your medicine in a safe place to protect it from theft. Do not share this medicine with anyone. Selling or giving away this medicine is dangerous and against the law. This medicine may cause accidental overdose and death if it taken by other adults, children, or pets. Mix any unused medicine with a substance like cat litter or coffee grounds. Then throw the medicine away in a sealed container like a sealed bag or a coffee can with a lid. Do not use the medicine after the expiration date. Store at room temperature between 15 and 30  degrees C (59 and 86 degrees F). NOTE: This sheet is a summary. It may not cover all possible information. If you have questions about this medicine, talk to your doctor, pharmacist, or health care provider.  2018 Elsevier/Gold Standard (2015-01-17 10:02:16) Clopidogrel tablets What is this medicine? CLOPIDOGREL (kloh PID oh grel) helps to prevent blood clots. This medicine is used to prevent heart attack, stroke, or other vascular events in people who are at high risk. This medicine may be used for other purposes; ask your health care provider or pharmacist if you have questions. COMMON BRAND NAME(S): Plavix What should I tell my health care provider before I take this medicine? They need to know if you have any of the following conditions: -bleeding  disorders -bleeding in the brain -having surgery -history of stomach bleeding -an unusual or allergic reaction to clopidogrel, other medicines, foods, dyes, or preservatives -pregnant or trying to get pregnant -breast-feeding How should I use this medicine? Take this medicine by mouth with a glass of water. Follow the directions on the prescription label. You may take this medicine with or without food. If it upsets your stomach, take it with food. Take your medicine at regular intervals. Do not take it more often than directed. Do not stop taking except on your doctor's advice. A special MedGuide will be given to you by the pharmacist with each prescription and refill. Be sure to read this information carefully each time. Talk to your pediatrician regarding the use of this medicine in children. Special care may be needed. Overdosage: If you think you have taken too much of this medicine contact a poison control center or emergency room at once. NOTE: This medicine is only for you. Do not share this medicine with others. What if I miss a dose? If you miss a dose, take it as soon as you can. If it is almost time for your next dose, take only that dose. Do not take double or extra doses. What may interact with this medicine? Do not take this medicine with the following medications: -dasabuvir; ombitasvir; paritaprevir; ritonavir -defibrotide This medicine may also interact with the following medications: -antiviral medicines for HIV or AIDS -aspirin -certain medicines for depression like citalopram, fluoxetine, fluvoxamine -certain medicines for fungal infections like ketoconazole, fluconazole, voriconazole -certain medicines for seizures like felbamate, oxcarbazepine, phenytoin -certain medicines for stomach problems like cimetidine, omeprazole, esomeprazole -certain medicines that treat or prevent blood clots like warfarin, enoxaparin, dalteparin, apixaban, dabigatran, rivaroxaban,  ticlopidine -chloramphenicol -cilostazol -fluvastatin -isoniazid -modafinil -nicardipine -NSAIDS, medicines for pain and inflammation, like ibuprofen or naproxen -quinine -repaglinide -tamoxifen -tolbutamide -topiramate -torsemide This list may not describe all possible interactions. Give your health care provider a list of all the medicines, herbs, non-prescription drugs, or dietary supplements you use. Also tell them if you smoke, drink alcohol, or use illegal drugs. Some items may interact with your medicine. What should I watch for while using this medicine? Visit your doctor or health care professional for regular check ups. Do not stop taking your medicine unless your doctor tells you to. Notify your doctor or health care professional and seek emergency treatment if you develop breathing problems; changes in vision; chest pain; severe, sudden headache; pain, swelling, warmth in the leg; trouble speaking; sudden numbness or weakness of the face, arm or leg. These can be signs that your condition has gotten worse. If you are going to have surgery or dental work, tell your doctor or health care professional that you are taking this  medicine. Certain genetic factors may reduce the effect of this medicine. Your doctor may use genetic tests to determine treatment. What side effects may I notice from receiving this medicine? Side effects that you should report to your doctor or health care professional as soon as possible: -allergic reactions like skin rash, itching or hives, swelling of the face, lips, or tongue -signs and symptoms of bleeding such as bloody or black, tarry stools; red or dark-brown urine; spitting up blood or brown material that looks like coffee grounds; red spots on the skin; unusual bruising or bleeding from the eye, gums, or nose -signs and symptoms of a blood clot such as breathing problems; changes in vision; chest pain; severe, sudden headache; pain, swelling, warmth  in the leg; trouble speaking; sudden numbness or weakness of the face, arm or leg Side effects that usually do not require medical attention (report to your doctor or health care professional if they continue or are bothersome): -constipation -diarrhea -headache -upset stomach This list may not describe all possible side effects. Call your doctor for medical advice about side effects. You may report side effects to FDA at 1-800-FDA-1088. Where should I keep my medicine? Keep out of the reach of children. Store at room temperature of 59 to 86 degrees F (15 to 30 degrees C). Throw away any unused medicine after the expiration date. NOTE: This sheet is a summary. It may not cover all possible information. If you have questions about this medicine, talk to your doctor, pharmacist, or health care provider.  2018 Elsevier/Gold Standard (2015-01-30 10:00:44) Atorvastatin tablets What is this medicine? ATORVASTATIN (a TORE va sta tin) is known as a HMG-CoA reductase inhibitor or 'statin'. It lowers the level of cholesterol and triglycerides in the blood. This drug may also reduce the risk of heart attack, stroke, or other health problems in patients with risk factors for heart disease. Diet and lifestyle changes are often used with this drug. This medicine may be used for other purposes; ask your health care provider or pharmacist if you have questions. COMMON BRAND NAME(S): Lipitor What should I tell my health care provider before I take this medicine? They need to know if you have any of these conditions: -frequently drink alcoholic beverages -history of stroke, TIA -kidney disease -liver disease -muscle aches or weakness -other medical condition -an unusual or allergic reaction to atorvastatin, other medicines, foods, dyes, or preservatives -pregnant or trying to get pregnant -breast-feeding How should I use this medicine? Take this medicine by mouth with a glass of water. Follow the  directions on the prescription label. You can take this medicine with or without food. Take your doses at regular intervals. Do not take your medicine more often than directed. Talk to your pediatrician regarding the use of this medicine in children. While this drug may be prescribed for children as young as 62 years old for selected conditions, precautions do apply. Overdosage: If you think you have taken too much of this medicine contact a poison control center or emergency room at once. NOTE: This medicine is only for you. Do not share this medicine with others. What if I miss a dose? If you miss a dose, take it as soon as you can. If it is almost time for your next dose, take only that dose. Do not take double or extra doses. What may interact with this medicine? Do not take this medicine with any of the following medications: -red yeast rice -telaprevir -telithromycin -voriconazole This medicine may  also interact with the following medications: -alcohol -antiviral medicines for HIV or AIDS -boceprevir -certain antibiotics like clarithromycin, erythromycin, troleandomycin -certain medicines for cholesterol like fenofibrate or gemfibrozil -cimetidine -clarithromycin -colchicine -cyclosporine -digoxin -male hormones, like estrogens or progestins and birth control pills -grapefruit juice -medicines for fungal infections like fluconazole, itraconazole, ketoconazole -niacin -rifampin -spironolactone This list may not describe all possible interactions. Give your health care provider a list of all the medicines, herbs, non-prescription drugs, or dietary supplements you use. Also tell them if you smoke, drink alcohol, or use illegal drugs. Some items may interact with your medicine. What should I watch for while using this medicine? Visit your doctor or health care professional for regular check-ups. You may need regular tests to make sure your liver is working properly. Tell your  doctor or health care professional right away if you get any unexplained muscle pain, tenderness, or weakness, especially if you also have a fever and tiredness. Your doctor or health care professional may tell you to stop taking this medicine if you develop muscle problems. If your muscle problems do not go away after stopping this medicine, contact your health care professional. This drug is only part of a total heart-health program. Your doctor or a dietician can suggest a low-cholesterol and low-fat diet to help. Avoid alcohol and smoking, and keep a proper exercise schedule. Do not use this drug if you are pregnant or breast-feeding. Serious side effects to an unborn child or to an infant are possible. Talk to your doctor or pharmacist for more information. This medicine may affect blood sugar levels. If you have diabetes, check with your doctor or health care professional before you change your diet or the dose of your diabetic medicine. If you are going to have surgery tell your health care professional that you are taking this drug. What side effects may I notice from receiving this medicine? Side effects that you should report to your doctor or health care professional as soon as possible: -allergic reactions like skin rash, itching or hives, swelling of the face, lips, or tongue -dark urine -fever -joint pain -muscle cramps, pain -redness, blistering, peeling or loosening of the skin, including inside the mouth -trouble passing urine or change in the amount of urine -unusually weak or tired -yellowing of eyes or skin Side effects that usually do not require medical attention (report to your doctor or health care professional if they continue or are bothersome): -constipation -heartburn -stomach gas, pain, upset This list may not describe all possible side effects. Call your doctor for medical advice about side effects. You may report side effects to FDA at 1-800-FDA-1088. Where should  I keep my medicine? Keep out of the reach of children. Store at room temperature between 20 to 25 degrees C (68 to 77 degrees F). Throw away any unused medicine after the expiration date. NOTE: This sheet is a summary. It may not cover all possible information. If you have questions about this medicine, talk to your doctor, pharmacist, or health care provider.  2018 Elsevier/Gold Standard (2011-03-16 64:33:29) Aspirin and Your Heart Aspirin is a medicine that affects the way blood clots. Aspirin can be used to help reduce the risk of blood clots, heart attacks, and other heart-related problems. Should I take aspirin? Your health care provider will help you determine whether it is safe and beneficial for you to take aspirin daily. Taking aspirin daily may be beneficial if you:  Have had a heart attack or chest pain.  Have  undergone open heart surgery such as coronary artery bypass surgery (CABG).  Have had coronary angioplasty.  Have experienced a stroke or transient ischemic attack (TIA).  Have peripheral vascular disease (PVD).  Have chronic heart rhythm problems such as atrial fibrillation.  Are there any risks of taking aspirin daily? Daily use of aspirin can increase your risk of side effects. Some of these include:  Bleeding. Bleeding problems can be minor or serious. An example of a minor problem is a cut that does not stop bleeding. An example of a more serious problem is stomach bleeding or bleeding into the brain. Your risk of bleeding is increased if you are also taking non-steroidal anti-inflammatory medicine (NSAIDs).  Increased bruising.  Upset stomach.  An allergic reaction. People who have nasal polyps have an increased risk of developing an aspirin allergy.  What are some guidelines I should follow when taking aspirin?  Take aspirin only as directed by your health care provider. Make sure you understand how much you should take and what form you should take. The  two forms of aspirin are: ? Non-enteric-coated. This type of aspirin does not have a coating and is absorbed quickly. Non-enteric-coated aspirin is usually recommended for people with chest pain. This type of aspirin also comes in a chewable form. ? Enteric-coated. This type of aspirin has a special coating that releases the medicine very slowly. Enteric-coated aspirin causes less stomach upset than non-enteric-coated aspirin. This type of aspirin should not be chewed or crushed.  Drink alcohol in moderation. Drinking alcohol increases your risk of bleeding. When should I seek medical care?  You have unusual bleeding or bruising.  You have stomach pain.  You have an allergic reaction. Symptoms of an allergic reaction include: ? Hives. ? Itchy skin. ? Swelling of the lips, tongue, or face.  You have ringing in your ears. When should I seek immediate medical care?  Your bowel movements are bloody, dark red, or black in color.  You vomit or cough up blood.  You have blood in your urine.  You cough, wheeze, or feel short of breath. If you have any of the following symptoms, this is an emergency. Do not wait to see if the pain will go away. Get medical help at once. Call your local emergency services (911 in the [place] Do not drive yourself to the hospital.  You have severe chest pain, especially if the pain is crushing or pressure-like and spreads to the arms, back, neck, or jaw.  You have stroke-like symptoms, such as: ? Loss of vision. ? Difficulty talking. ? Numbness or weakness on one side of your body. ? Numbness or weakness in your arm or leg. ? Not thinking clearly or feeling confused.  This information is not intended to replace advice given to you by your health care provider. Make sure you discuss any questions you have with your health care provider. Document Released: 04/08/2008 Document Revised: 09/03/2015 Document Reviewed: 08/01/2013 Elsevier Interactive Patient  Education  2018 [place]. Mechanical Thrombectomy for Ischemic Stroke, Care After Refer to this sheet in the next few weeks. These instructions provide you with information on caring for yourself after your procedure. Your health care provider may also give you more specific instructions. Your treatment has been planned according to current medical practices, but problems sometimes occur. Call your health care provider if you have any problems or questions after your procedure. What can I expect after the procedure? After your procedure, it is common to have  bruising, soreness, and swelling at the catheter insertion site. Follow these instructions at home: Insertion Site Care  Follow instructions from your health care provider about how to take care of your insertion site. Make sure you: ? Wash your hands with soap and water before you change your bandage (dressing). If soap and water are not available, use hand sanitizer. ? Change your dressing as told by your health care provider.  Check the insertion site every day for signs of infection. Check for: ? More redness, swelling, or pain. ? More fluid or blood. ? Warmth. ? Pus or a bad smell. General instructions  Take over-the-counter and prescription medicines only as told by your health care provider.  Return to your normal activities as told by your health care provider. Ask your health care provider what activities are safe for you.  Do not use tobacco products, including cigarettes, chewing tobacco, or e-cigarettes. If you need help quitting, ask your health care provider.  Keep all follow-up visits as told by your health care provider. This is important. Contact a health care provider if:  You have more redness, swelling, or pain around your insertion site.  Your insertion site feels warm to the touch.  You have a fever. Get help right away if:  You have more fluid or blood coming from your insertion site.  You have pus  or a bad smell coming from your insertion site.  You have symptoms of another stroke. These include a drooping face, arm weakness, or speech difficulty. Stroke symptoms may represent a serious problem that is an emergency. Do not wait to see if the symptoms will go away. Get medical help right away. Call your local emergency services (911 in the [place] Do not drive yourself to the hospital. This information is not intended to replace advice given to you by your health care provider. Make sure you discuss any questions you have with your health care provider. Document Released: 08/21/2010 Document Revised: 12/25/2015 Document Reviewed: 04/21/2015 Elsevier Interactive Patient Education  2017 [employer].

## 2017-03-19 NOTE — Discharge Summary (Signed)
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 Hyperlipidemia     HOSPITAL COURSE:   1.   Ischemia of the left lower extremity.  Patient was taken to the vascular lab and had thrombolysis, thrombectomy 3 stents and 3 angioplasties done yesterday.  The patient was on Aggrastat drip after the procedure.  Patient was seen by vascular surgery on the day of discharge and was switched over to aspirin and Plavix with follow-up in 2 weeks with vascular surgery as outpatient.  Lipitor added.  Advised to stop smoking.  Echocardiogram done but not read yet.  As per vascular surgery, light duty at work until follow-up appointment with Dr. Lucky Cowboy. 2.  Impaired fasting glucose.  Add on a hemoglobin A1c to today's labs. 3.  Tobacco abuse.  Patient must stop smoking.  DISCHARGE CONDITIONS:   Satisfactory  CONSULTS OBTAINED:  Treatment Team:  Algernon Huxley, MD  DRUG ALLERGIES:  No Known Allergies  DISCHARGE MEDICATIONS:   Current Discharge Medication List    START taking these medications   Details  acetaminophen (TYLENOL) 325 MG tablet Take 2 tablets (650 mg total) every 6 (six) hours as needed by mouth for mild pain (or Fever >/= 101).    aspirin EC 81 MG EC tablet Take 1 tablet (81 mg total) daily by mouth. Qty: 30 tablet, Refills: 0    atorvastatin (LIPITOR) 20 MG tablet Take 1 tablet (20 mg total) daily at 6 PM by mouth. Qty: 30 tablet, Refills: 0    clopidogrel (PLAVIX) 75 MG tablet Take 1  tablet (75 mg total) daily by mouth. Qty: 30 tablet, Refills: 0    feeding supplement, ENSURE ENLIVE, (ENSURE ENLIVE) LIQD Take 237 mLs 2 (two) times daily between meals by mouth. Qty: 60 Bottle, Refills: 0    HYDROcodone-acetaminophen (NORCO/VICODIN) 5-325 MG tablet Take 1 tablet every 6 (six) hours as needed by mouth for severe pain. Qty: 10 tablet, Refills: 0      CONTINUE these medications which have NOT CHANGED   Details  Urea 39 % CREA Apply 1 application topically daily. Qty: 1 Bottle, Refills: 0         DISCHARGE INSTRUCTIONS:   Follow-up 1 week PMD Follow-up 2 weeks vascular surgery  If you experience worsening of your admission symptoms, develop shortness of breath, life threatening emergency, suicidal or homicidal thoughts you must seek medical attention immediately by calling 911 or calling your MD immediately  if symptoms less severe.  You Must read complete instructions/literature along with all the possible adverse reactions/side effects for all the Medicines you take and that have been prescribed to you. Take any new Medicines after you have completely understood and accept all the possible adverse reactions/side effects.   Please note  You were cared for by a hospitalist during your hospital stay. If you have any questions about your discharge medications or the care you received while you were in the hospital after you are discharged, you can call the unit and asked to  speak with the hospitalist on call if the hospitalist that took care of you is not available. Once you are discharged, your primary care physician will handle any further medical issues. Please note that NO REFILLS for any discharge medications will be authorized once you are discharged, as it is imperative that you return to your primary care physician (or establish a relationship with a primary care physician if you do not have one) for your aftercare needs so that they can reassess your need for  medications and monitor your lab values.    Today   CHIEF COMPLAINT:   Chief Complaint  Patient presents with  . Leg Pain    HISTORY OF PRESENT ILLNESS:  Barry Lee  is a 56 y.o. male came in with leg pain   VITAL SIGNS:  Blood pressure 132/68, pulse 61, temperature 98.9 F (37.2 C), temperature source Oral, resp. rate 18, height 6' (1.829 m), weight 77.1 kg (170 lb), SpO2 98 %.      DATA REVIEW:   CBC Recent Labs  Lab 03/19/17 0459  WBC 8.4  HGB 13.9  HCT 42.3  PLT 157    Chemistries  Recent Labs  Lab 03/19/17 0502  NA 136  K 4.1  CL 104  CO2 25  GLUCOSE 100*  BUN 11  CREATININE 0.83  CALCIUM 8.6*     RADIOLOGY:  Ct Angio Aortobifemoral W And/or Wo Contrast  Result Date: 03/17/2017 CLINICAL DATA:  Severe left foot pain this morning. Intermittent Doppler pulses. EXAM: CT ANGIOGRAPHY AOBIFEM WITHOUT AND WITH CONTRAST<Procedure Description>CT ANGIOGRAPHY AOBIFEM WITHOUT AND WITH CONTRAST TECHNIQUE: Axial images were obtained from the level of the lower chest through the feet after administration of IV contrast material. Coronal and sagittal reconstructions were performed. CONTRAST:  130mL ISOVUE-370 IOPAMIDOL (ISOVUE-370) INJECTION 76%<Contrast>168mL ISOVUE-370 IOPAMIDOL (ISOVUE-370) INJECTION 76% COMPARISON:  None. FINDINGS: Scattered mild atherosclerosis of the normal caliber abdominal aorta. Normal flow within the celiac, superior mesenteric and inferior mesenteric artery branches. Complete occlusion within the proximal portion of the left common iliac artery. No flow seen within the left external iliac artery. There is reconstitution of flow within the left internal iliac artery and at the left femoral artery via collaterals. Normal flow is seen throughout the left SFA and profundus arteries. Normal flow is seen within the left popliteal artery. Partially occlusive thrombus noted at the junction of the left anterior tibial artery and tibioperoneal trunk.  This is reflected by diminished flow in the anterior tibial artery and no appreciable flow in the anterior tibial artery just above the level of the left ankle. Some flow is seen within the left peroneal and posterior tibial arteries. There is normal flow throughout the right lower extremity. Liver, gallbladder, pancreas, and spleen appear normal. Adrenal glands and kidneys are unremarkable. Small right renal cyst. Incidental note made of a retroaortic left renal vein. No dilated large or small bowel loops. No evidence of bowel wall thickening or bowel wall inflammation seen. No free fluid or abscess collection within the abdomen or pelvis. No free intraperitoneal air seen. Osseous structures are unremarkable. Lung bases are clear. Review of the MIP images confirms the above findings. IMPRESSION: 1. 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 There is associated diminished flow within the left anterior tibial artery with no appreciable flow in the left anterior tibia artery just above the level  of the left ankle. Grossly normal contrast flow is seen within the left peroneal and posterior tibial arteries to the level of the ankle. 2. 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 Reconstitution of flow within the left femoral artery via collaterals with normal contrast flow seen through the left SFA and left popliteal artery. There is also reconstitution of flow in the left internal iliac artery via collaterals. 3. Normal contrast flow throughout the right lower extremity arteries. 4. No acute appearing findings within the abdomen or pelvis. No evidence of neoplastic process seen. These results were called by telephone at the time of interpretation on 03/17/2017 at 6:30 pm to Dr. Merlyn Lot , who verbally acknowledged these results. Electronically Signed   By: Franki Cabot M.D.   On: 03/17/2017 18:34   Dg Chest Portable 1 View  Result Date: 03/17/2017 CLINICAL DATA:  Cough tobacco use.  Lower extremity claudication EXAM: PORTABLE CHEST 1 VIEW COMPARISON:  None. FINDINGS: The lungs are clear. Heart size and pulmonary vascularity are normal. No adenopathy. No bone lesions. Trachea appears normal. IMPRESSION: No edema or consolidation. Electronically Signed   By: Lowella Grip III M.D.   On: 03/17/2017 16:32       Management plans discussed with the patient, family and they are in agreement.  CODE STATUS:     Code Status Orders  (From admission, onward)        Start     Ordered   03/17/17 2055  Full code  Continuous     03/17/17 2054    Code Status History    Date Active Date Inactive Code Status Order ID Comments User Context   This patient has a current code status but no historical code status.      TOTAL TIME TAKING CARE OF THIS PATIENT: 40 minutes.    Loletha Grayer M.D on 03/19/2017 at 1:32 PM  Between 7am to 6pm - Pager - [phone]  After 6pm go to [email or web address] - password [email or web address]  [facility] Office  [phone]  CC: Primary care physician; Dr Inis Sizer

## 2017-03-19 NOTE — Progress Notes (Signed)
Patient discharged via wheelchair and private vehicle. IV removed and catheter intact. All discharge instructions given and patient verbalizes understanding. Tele removed and returned. Prescriptions given to patient No distress noted.   

## 2017-03-19 NOTE — Progress Notes (Signed)
Patient ID: Barry Lee, male   DOB: [birth year], 56 y.o.   MRN: [id number] [name] was admitted to the [facility] on 03/17/2017 and Discharged  03/19/2017 and should be excused from work/school   for 6 days starting 03/17/2017 , may return to work on light duty until seen by Dr Lucky Cowboy in follow up appointment.  Loletha Grayer M.D on 03/19/2017,at 12:49 PM  [place] [facility] [place]

## 2017-03-19 NOTE — Progress Notes (Signed)
Patient ID: Barry Lee, male   DOB: 1960/12/10, 56 y.o.   MRN: [id number]  Came back to speak with family and patient about discharge. Also spoke with vascular surgery. Prescriptions written for patient Work note written for patient  Total time spent on taking care of patient today 40 minutes

## 2017-03-19 NOTE — Progress Notes (Signed)
[place] at [place] NAME: Saxton Chain    MR#:  [id number]  DATE OF BIRTH:  August 25, 1960  SUBJECTIVE:  CHIEF COMPLAINT:   Chief Complaint  Patient presents with  . Leg Pain   Patient seen and examined on 03/18/2017.  No pain after procedure. Afebrile  REVIEW OF SYSTEMS:    Review of Systems  Constitutional: Negative for chills and fever.  HENT: Negative for sore throat.   Eyes: Negative for blurred vision, double vision and pain.  Respiratory: Negative for cough, hemoptysis, shortness of breath and wheezing.   Cardiovascular: Negative for chest pain, palpitations, orthopnea and leg swelling.  Gastrointestinal: Negative for abdominal pain, constipation, diarrhea, heartburn, nausea and vomiting.  Genitourinary: Negative for dysuria and hematuria.  Musculoskeletal: Negative for back pain and joint pain.  Skin: Negative for rash.  Neurological: Negative for sensory change, speech change, focal weakness and headaches.  Endo/Heme/Allergies: Does not bruise/bleed easily.  Psychiatric/Behavioral: Negative for depression. The patient is not nervous/anxious.     DRUG ALLERGIES:  No Known Allergies  VITALS:  Blood pressure 132/68, pulse 61, temperature 98.9 F (37.2 C), temperature source Oral, resp. rate 18, height 6' (1.829 m), weight 77.1 kg (170 lb), SpO2 98 %.  PHYSICAL EXAMINATION:   Physical Exam  GENERAL:  56 y.o.-year-old patient lying in the bed with no acute distress.  EYES: Pupils equal, round, reactive to light and accommodation. No scleral icterus. Extraocular muscles intact.  HEENT: Head atraumatic, normocephalic. Oropharynx and nasopharynx clear.  NECK:  Supple, no jugular venous distention. No thyroid enlargement, no tenderness.  LUNGS: Normal breath sounds bilaterally, no wheezing, rales, rhonchi. No use of accessory muscles of respiration.  CARDIOVASCULAR: S1, S2 normal. No murmurs, rubs, or gallops.  ABDOMEN: Soft,  nontender, nondistended. Bowel sounds present. No organomegaly or mass.  EXTREMITIES: No cyanosis, clubbing or edema b/l. Warm extremities. Feeble left dorsalis pedis NEUROLOGIC: Cranial nerves II through XII are intact. No focal Motor or sensory deficits b/l.   PSYCHIATRIC: The patient is alert and oriented x 3.  SKIN: No obvious rash, lesion, or ulcer.   LABORATORY PANEL:   CBC Recent Labs  Lab 03/19/17 0459  WBC 8.4  HGB 13.9  HCT 42.3  PLT 157   ------------------------------------------------------------------------------------------------------------------ Chemistries  Recent Labs  Lab 03/19/17 0502  NA 136  K 4.1  CL 104  CO2 25  GLUCOSE 100*  BUN 11  CREATININE 0.83  CALCIUM 8.6*   ------------------------------------------------------------------------------------------------------------------  Cardiac Enzymes No results for input(s): TROPONINI in the last 168 hours. ------------------------------------------------------------------------------------------------------------------  RADIOLOGY:  Ct Angio Aortobifemoral W And/or Wo Contrast  Result Date: 03/17/2017 CLINICAL DATA:  Severe left foot pain this morning. Intermittent Doppler pulses. EXAM: CT ANGIOGRAPHY AOBIFEM WITHOUT AND WITH CONTRAST<Procedure Description>CT ANGIOGRAPHY AOBIFEM WITHOUT AND WITH CONTRAST TECHNIQUE: Axial images were obtained from the level of the lower chest through the feet after administration of IV contrast material. Coronal and sagittal reconstructions were performed. CONTRAST:  147mL ISOVUE-370 IOPAMIDOL (ISOVUE-370) INJECTION 76%<Contrast>179mL ISOVUE-370 IOPAMIDOL (ISOVUE-370) INJECTION 76% COMPARISON:  None. FINDINGS: Scattered mild atherosclerosis of the normal caliber abdominal aorta. Normal flow within the celiac, superior mesenteric and inferior mesenteric artery branches. Complete occlusion within the proximal portion of the left common iliac artery. No flow seen within the  left external iliac artery. There is reconstitution of flow within the left internal iliac artery and at the left femoral artery via collaterals. Normal flow is seen throughout the left SFA and profundus arteries. Normal flow  is seen within the left popliteal artery. Partially occlusive thrombus noted at the junction of the left anterior tibial artery and tibioperoneal trunk. This is reflected by diminished flow in the anterior tibial artery and no appreciable flow in the anterior tibial artery just above the level of the left ankle. Some flow is seen within the left peroneal and posterior tibial arteries. There is normal flow throughout the right lower extremity. Liver, gallbladder, pancreas, and spleen appear normal. Adrenal glands and kidneys are unremarkable. Small right renal cyst. Incidental note made of a retroaortic left renal vein. No dilated large or small bowel loops. No evidence of bowel wall thickening or bowel wall inflammation seen. No free fluid or abscess collection within the abdomen or pelvis. No free intraperitoneal air seen. Osseous structures are unremarkable. Lung bases are clear. Review of the MIP images confirms the above findings. IMPRESSION: 1. 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 There is associated diminished flow within the left anterior tibial artery with no appreciable flow in the left anterior tibia artery just above the level of the left ankle. Grossly normal contrast flow is seen within the left peroneal and posterior tibial arteries to the level of the ankle. 2. 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 Reconstitution of flow within the left femoral artery via collaterals with normal contrast flow seen through the left SFA and left popliteal  artery. There is also reconstitution of flow in the left internal iliac artery via collaterals. 3. Normal contrast flow throughout the right lower extremity arteries. 4. No acute appearing findings within the abdomen or pelvis. No evidence of neoplastic process seen. These results were called by telephone at the time of interpretation on 03/17/2017 at 6:30 pm to Dr. Merlyn Lot , who verbally acknowledged these results. Electronically Signed   By: Franki Cabot M.D.   On: 03/17/2017 18:34   Dg Chest Portable 1 View  Result Date: 03/17/2017 CLINICAL DATA:  Cough tobacco use.  Lower extremity claudication EXAM: PORTABLE CHEST 1 VIEW COMPARISON:  None. FINDINGS: The lungs are clear. Heart size and pulmonary vascularity are normal. No adenopathy. No bone lesions. Trachea appears normal. IMPRESSION: No edema or consolidation. Electronically Signed   By: Lowella Grip III M.D.   On: 03/17/2017 16:32     ASSESSMENT AND PLAN:   * Left lower extremity extensive thrombosis, acute and chronic Had thrombolysis, thrombectomy, 3 stents and angioplasty. On heparin drip. ASA, Statin. Plavix vs Eliquis per vascular surgery. Counseled to quit smoking  All the records are reviewed and case discussed with Care Management/Social Workerr. Management plans discussed with the patient, family and they are in agreement.  CODE STATUS: FULL CODE  DVT Prophylaxis: Heparin drip  TOTAL TIME TAKING CARE OF THIS PATIENT: 30 minutes.   POSSIBLE D/C IN 1-2 DAYS, DEPENDING ON CLINICAL CONDITION.  Hillary Bow R M.D on 03/19/2017 at 10:51 AM  Between 7am to 6pm - Pager - [phone]  After 6pm go to [email or web address] - password [email or web address] [facility]  Office  [phone]  CC: Primary care physician; Ok Edwards, NP  Note: This dictation was prepared with Dragon dictation along with smaller phrase technology. Any transcriptional errors that result from this process are  unintentional.

## 2017-03-19 NOTE — Progress Notes (Signed)
Patient ID: Barry Lee, male   DOB: 1960-10-10, 56 y.o.   MRN: [id number]  [facility] PROGRESS NOTE  Barry Lee PXT:[id number] DOB: 12-04-1960 DOA: 03/17/2017 PCP: Barry Edwards, NP  HPI/Subjective: Patient feeling better.  He has noticed pain in his left leg for about a week prior to coming in.  He had his procedure yesterday which included thrombolysis, thrombectomy, 3 stents and 3 angioplasties.  He has not walked on it yet.  Objective: Vitals:   03/18/17 1923 03/19/17 0453  BP: 129/64 123/67  Pulse: 62 62  Resp: 18 18  Temp: 99.4 F (37.4 C) 98.9 F (37.2 C)  SpO2: 99% 98%    Intake/Output Summary (Last 24 hours) at 03/19/2017 0703 Last data filed at 03/19/2017 0456 Gross per 24 hour  Intake 492.38 ml  Output 1100 ml  Net -607.62 ml   Filed Weights   03/17/17 1513 03/18/17 0841  Weight: 77.1 kg (170 lb) 77.1 kg (170 lb)    ROS: Review of Systems  Constitutional: Negative for chills and fever.  Eyes: Negative for blurred vision.  Respiratory: Negative for cough and shortness of breath.   Cardiovascular: Negative for chest pain.  Gastrointestinal: Negative for constipation, diarrhea, nausea and vomiting.  Genitourinary: Negative for dysuria.  Musculoskeletal: Negative for joint pain.  Neurological: Negative for dizziness and headaches.   Exam: Physical Exam  Constitutional: He is oriented to person, place, and time.  HENT:  Nose: No mucosal edema.  Mouth/Throat: No oropharyngeal exudate or posterior oropharyngeal edema.  Eyes: Conjunctivae, EOM and lids are normal. Pupils are equal, round, and reactive to light.  Neck: No JVD present. Carotid bruit is not present. No edema present. No thyroid mass and no thyromegaly present.  Cardiovascular: Regular rhythm, S1 normal, S2 normal and normal heart sounds. Exam reveals no gallop.  No murmur heard. Pulses:      Popliteal pulses are 1+ on the right side, and 1+ on the left side.   Dorsalis pedis pulses are 1+ on the right side, and 0 on the left side.  Respiratory: No respiratory distress. He has no wheezes. He has no rhonchi. He has no rales.  GI: Soft. Bowel sounds are normal. There is no tenderness.  Musculoskeletal:       Right ankle: He exhibits no swelling.       Left ankle: He exhibits no swelling.  Lymphadenopathy:    He has no cervical adenopathy.  Neurological: He is alert and oriented to person, place, and time. No cranial nerve deficit.  Skin: Skin is warm. No rash noted. Nails show no clubbing.  Psychiatric: He has a normal mood and affect.      Data Reviewed: Basic Metabolic Panel: Recent Labs  Lab 03/17/17 1613 03/18/17 0011  NA 139 138  K 4.2 3.5  CL 104 108  CO2 27 24  GLUCOSE 79 126*  BUN 10 9  CREATININE 0.91 0.83  CALCIUM 8.8* 8.3*   CBC: Recent Labs  Lab 03/17/17 1613 03/18/17 0011 03/19/17 0459  WBC 8.3 7.9 8.4  NEUTROABS 4.5  --   --   HGB 16.6 14.1 13.9  HCT 50.9 42.5 42.3  MCV 93.7 91.9 92.3  PLT 169 157 157     Studies: Ct Angio Aortobifemoral W And/or Wo Contrast  Result Date: 03/17/2017 CLINICAL DATA:  Severe left foot pain this morning. Intermittent Doppler pulses. EXAM: CT ANGIOGRAPHY AOBIFEM WITHOUT AND WITH CONTRAST<Procedure Description>CT ANGIOGRAPHY AOBIFEM WITHOUT AND WITH CONTRAST TECHNIQUE: Axial images were  obtained from the level of the lower chest through the feet after administration of IV contrast material. Coronal and sagittal reconstructions were performed. CONTRAST:  121mL ISOVUE-370 IOPAMIDOL (ISOVUE-370) INJECTION 76%<Contrast>112mL ISOVUE-370 IOPAMIDOL (ISOVUE-370) INJECTION 76% COMPARISON:  None. FINDINGS: Scattered mild atherosclerosis of the normal caliber abdominal aorta. Normal flow within the celiac, superior mesenteric and inferior mesenteric artery branches. Complete occlusion within the proximal portion of the left common iliac artery. No flow seen within the left external iliac artery.  There is reconstitution of flow within the left internal iliac artery and at the left femoral artery via collaterals. Normal flow is seen throughout the left SFA and profundus arteries. Normal flow is seen within the left popliteal artery. Partially occlusive thrombus noted at the junction of the left anterior tibial artery and tibioperoneal trunk. This is reflected by diminished flow in the anterior tibial artery and no appreciable flow in the anterior tibial artery just above the level of the left ankle. Some flow is seen within the left peroneal and posterior tibial arteries. There is normal flow throughout the right lower extremity. Liver, gallbladder, pancreas, and spleen appear normal. Adrenal glands and kidneys are unremarkable. Small right renal cyst. Incidental note made of a retroaortic left renal vein. No dilated large or small bowel loops. No evidence of bowel wall thickening or bowel wall inflammation seen. No free fluid or abscess collection within the abdomen or pelvis. No free intraperitoneal air seen. Osseous structures are unremarkable. Lung bases are clear. Review of the MIP images confirms the above findings. IMPRESSION: 1. 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 There is associated diminished flow within the left anterior tibial artery with no appreciable flow in the left anterior tibia artery just above the level of the left ankle. Grossly normal contrast flow is seen within the left peroneal and posterior tibial arteries to the level of the ankle. 2. 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 Reconstitution of flow within the left femoral artery via collaterals with normal contrast flow seen through the left SFA and left popliteal artery. There is also  reconstitution of flow in the left internal iliac artery via collaterals. 3. Normal contrast flow throughout the right lower extremity arteries. 4. No acute appearing findings within the abdomen or pelvis. No evidence of neoplastic process seen. These results were called by telephone at the time of interpretation on 03/17/2017 at 6:30 pm to Dr. Merlyn Lot , who verbally acknowledged these results. Electronically Signed   By: Franki Cabot M.D.   On: 03/17/2017 18:34   Dg Chest Portable 1 View  Result Date: 03/17/2017 CLINICAL DATA:  Cough tobacco use.  Lower extremity claudication EXAM: PORTABLE CHEST 1 VIEW COMPARISON:  None. FINDINGS: The lungs are clear. Heart size and pulmonary vascularity are normal. No adenopathy. No bone lesions. Trachea appears normal. IMPRESSION: No edema or consolidation. Electronically Signed   By: Lowella Grip III M.D.   On: 03/17/2017 16:32    Scheduled Meds: . aspirin EC  81 mg Oral Daily  . atorvastatin  20 mg Oral q1800  . feeding supplement (ENSURE ENLIVE)  237 mL Oral BID BM  . multivitamin with minerals  1 tablet Oral Daily  . sodium chloride flush  3 mL Intravenous Q12H   Continuous Infusions: . tirofiban 0.15 mcg/kg/min (  03/18/17 1333)    Assessment/Plan:  1. Ischemia of the left lower extremity.  Patient on Aggrastat drip.  Patient status post thrombolysis, thrombectomy, 3 stents and 3 angioplasties yesterday.  Continue blood thinner as per vascular surgery.  Obtain echocardiogram.  Patient must stop smoking.  Will need blood thinner upon discharge. 2. Impaired fasting glucose.  Add on hemoglobin A1c for tomorrow 3. Tobacco abuse.  Patient must stop smoking.  Code Status:     Code Status Orders  (From admission, onward)        Start     Ordered   03/17/17 2055  Full code  Continuous     03/17/17 2054    Code Status History    Date Active Date Inactive Code Status Order ID Comments User Context   This patient has a current code  status but no historical code status.     Disposition Plan: We will leave the timing of disposition up to the vascular surgery team  Consultants:  Vascular surgery  Procedures:  Vascular surgery procedure please see operative note  Time spent: 25 minutes  Van, [place]

## 2017-03-20 LAB — HEMOGLOBIN A1C
HEMOGLOBIN A1C: 5.8 % — AB (ref 4.8–5.6)
Mean Plasma Glucose: 119.76 mg/dL

## 2017-03-21 ENCOUNTER — Encounter: Payer: Self-pay | Admitting: Vascular Surgery

## 2017-03-21 ENCOUNTER — Telehealth: Payer: Self-pay | Admitting: Podiatry

## 2017-03-21 NOTE — Telephone Encounter (Signed)
Called pt regarding orthotic coverage and they are not covered and pt states go ahead and order orthotics

## 2017-04-05 ENCOUNTER — Encounter (INDEPENDENT_AMBULATORY_CARE_PROVIDER_SITE_OTHER): Payer: Self-pay | Admitting: Vascular Surgery

## 2017-04-05 ENCOUNTER — Ambulatory Visit (INDEPENDENT_AMBULATORY_CARE_PROVIDER_SITE_OTHER): Payer: 59 | Admitting: Vascular Surgery

## 2017-04-05 VITALS — BP 139/86 | HR 61 | Resp 16 | Ht 72.0 in | Wt 164.0 lb

## 2017-04-05 DIAGNOSIS — E785 Hyperlipidemia, unspecified: Secondary | ICD-10-CM | POA: Diagnosis not present

## 2017-04-05 DIAGNOSIS — I998 Other disorder of circulatory system: Secondary | ICD-10-CM

## 2017-04-05 NOTE — Assessment & Plan Note (Signed)
Extensive left lower extremity revascularization performed with marked improvement in his symptoms.  Resume all normal activities.  Return to clinic in about a month to recheck noninvasive studies.  Continue current medical regimen.

## 2017-04-05 NOTE — Assessment & Plan Note (Signed)
lipid control important in reducing the progression of atherosclerotic disease. Continue statin therapy  

## 2017-04-05 NOTE — Progress Notes (Signed)
Patient ID: Barry Lee, male   DOB: 1960-09-19, 56 y.o.   MRN: [id number]  Chief Complaint  Patient presents with  . New Patient (Initial Visit)    2wk [facility]    HPI Barry Lee is a 56 y.o. male.  Patient follows up 2 weeks after hospital discharge for limb threatening ischemia with revascularization in a percutaneous fashion.  He still has some left leg and foot pain although it is markedly improved.  He had extensive revascularization and remains on blood thinners.  His foot is warm.  He has no ulceration.  His access site is well-healed.  Past Medical History:  Diagnosis Date  . Hyperlipidemia     Past Surgical History:  Procedure Laterality Date  . COLONOSCOPY WITH PROPOFOL N/A 12/17/2015   Procedure: COLONOSCOPY WITH PROPOFOL;  Surgeon: Lollie Sails, MD;  Location: [facility] ENDOSCOPY;  Service: Endoscopy;  Laterality: N/A;  . LOWER EXTREMITY ANGIOGRAPHY Left 03/18/2017   Procedure: Lower Extremity Angiography;  Surgeon: Algernon Huxley, MD;  Location: [facility] CV LAB;  Service: Cardiovascular;  Laterality: Left;    Family History  Problem Relation Age of Onset  . Diabetes Mother   . Alzheimer's disease Father     Social History Social History   Tobacco Use  . Smoking status: Former Smoker    Packs/day: 0.75    Years: 30.00    Pack years: 22.50    Types: Cigars, Cigarettes  . Smokeless tobacco: Never Used  Substance Use Topics  . Alcohol use: No    Comment: rare  . Drug use: No    No Known Allergies  Current Outpatient Medications  Medication Sig Dispense Refill  . acetaminophen (TYLENOL) 325 MG tablet Take 2 tablets (650 mg total) every 6 (six) hours as needed by mouth for mild pain (or Fever >/= 101).    Marland Kitchen aspirin EC 81 MG EC tablet Take 1 tablet (81 mg total) daily by mouth. 30 tablet 0  . atorvastatin (LIPITOR) 20 MG tablet Take 1 tablet (20 mg total) daily at 6 PM by mouth. 30 tablet 0  . clopidogrel (PLAVIX) 75 MG tablet Take 1 tablet (75 mg  total) daily by mouth. 30 tablet 0  . feeding supplement, ENSURE ENLIVE, (ENSURE ENLIVE) LIQD Take 237 mLs 2 (two) times daily between meals by mouth. 60 Bottle 0  . Urea 39 % CREA Apply 1 application topically daily. 1 Bottle 0  . HYDROcodone-acetaminophen (NORCO/VICODIN) 5-325 MG tablet Take 1 tablet every 6 (six) hours as needed by mouth for severe pain. (Patient not taking: Reported on 04/05/2017) 10 tablet 0   No current facility-administered medications for this visit.       REVIEW OF SYSTEMS (Negative unless checked)  Constitutional: '[]'$ Weight loss  '[]'$ Fever  '[]'$ Chills Cardiac: '[]'$ Chest pain   '[]'$ Chest pressure   '[]'$ Palpitations   '[]'$ Shortness of breath when laying flat   '[]'$ Shortness of breath at rest   '[]'$ Shortness of breath with exertion. Vascular:  '[x]'$ Pain in legs with walking   '[]'$ Pain in legs at rest   '[]'$ Pain in legs when laying flat   '[]'$ Claudication   '[]'$ Pain in feet when walking  '[]'$ Pain in feet at rest  '[]'$ Pain in feet when laying flat   '[]'$ History of DVT   '[]'$ Phlebitis   '[x]'$ Swelling in legs   '[]'$ Varicose veins   '[]'$ Non-healing ulcers Pulmonary:   '[]'$ Uses home oxygen   '[]'$ Productive cough   '[]'$ Hemoptysis   '[]'$ Wheeze  '[]'$ COPD   '[]'$ Asthma Neurologic:  '[]'$ Dizziness  '[]'$   Blackouts   '[]'$ Seizures   '[]'$ History of stroke   '[]'$ History of TIA  '[]'$ Aphasia   '[]'$ Temporary blindness   '[]'$ Dysphagia   '[]'$ Weakness or numbness in arms   '[]'$ Weakness or numbness in legs Musculoskeletal:  '[]'$ Arthritis   '[]'$ Joint swelling   '[]'$ Joint pain   '[]'$ Low back pain Hematologic:  '[]'$ Easy bruising  '[]'$ Easy bleeding   '[]'$ Hypercoagulable state   '[]'$ Anemic  '[]'$ Hepatitis Gastrointestinal:  '[]'$ Blood in stool   '[]'$ Vomiting blood  '[]'$ Gastroesophageal reflux/heartburn   '[]'$ Abdominal pain Genitourinary:  '[]'$ Chronic kidney disease   '[]'$ Difficult urination  '[]'$ Frequent urination  '[]'$ Burning with urination   '[]'$ Hematuria Skin:  '[]'$ Rashes   '[]'$ Ulcers   '[]'$ Wounds Psychological:  '[]'$ History of anxiety   '[]'$  History of major depression.    Physical Exam BP 139/86 (BP Location:  Right Arm)   Pulse 61   Resp 16   Ht 6' (1.829 m)   Wt 164 lb (74.4 kg)   BMI 22.24 kg/m  Gen:  WD/WN, NAD Head: [place]/AT, No temporalis wasting.  Ear/Nose/Throat: Hearing grossly intact, nares w/o erythema or drainage, oropharynx w/o Erythema/Exudate Eyes: Conjunctiva clear, sclera non-icteric  Neck: trachea midline.  No JVD.  Pulmonary:  Good air movement, respirations not labored Cardiac: RRR Vascular:  Vessel Right Left  Radial Palpable Palpable                      Popliteal 1+ Palpable 1+ Palpable  PT 1+ Palpable Not Palpable  DP 1+ Palpable 1+ Palpable    Musculoskeletal: M/S 5/5 throughout.  Extremities without ischemic changes.  No deformity or atrophy. Trace LLE edema. Neurologic: Sensation grossly intact in extremities.  Symmetrical.  Speech is fluent. Motor exam as listed above. Psychiatric: Judgment intact, Mood & affect appropriate for pt's clinical situation. Dermatologic: No rashes or ulcers noted.  No cellulitis or open wounds.  Radiology Ct Angio Aortobifemoral W And/or Wo Contrast  Result Date: 03/17/2017 CLINICAL DATA:  Severe left foot pain this morning. Intermittent Doppler pulses. EXAM: CT ANGIOGRAPHY AOBIFEM WITHOUT AND WITH CONTRAST<Procedure Description>CT ANGIOGRAPHY AOBIFEM WITHOUT AND WITH CONTRAST TECHNIQUE: Axial images were obtained from the level of the lower chest through the feet after administration of IV contrast material. Coronal and sagittal reconstructions were performed. CONTRAST:  135m ISOVUE-370 IOPAMIDOL (ISOVUE-370) INJECTION 76%<Contrast>1266mISOVUE-370 IOPAMIDOL (ISOVUE-370) INJECTION 76% COMPARISON:  None. FINDINGS: Scattered mild atherosclerosis of the normal caliber abdominal aorta. Normal flow within the celiac, superior mesenteric and inferior mesenteric artery branches. Complete occlusion within the proximal portion of the left common iliac artery. No flow seen within the left external iliac artery. There is reconstitution of flow  within the left internal iliac artery and at the left femoral artery via collaterals. Normal flow is seen throughout the left SFA and profundus arteries. Normal flow is seen within the left popliteal artery. Partially occlusive thrombus noted at the junction of the left anterior tibial artery and tibioperoneal trunk. This is reflected by diminished flow in the anterior tibial artery and no appreciable flow in the anterior tibial artery just above the level of the left ankle. Some flow is seen within the left peroneal and posterior tibial arteries. There is normal flow throughout the right lower extremity. Liver, gallbladder, pancreas, and spleen appear normal. Adrenal glands and kidneys are unremarkable. Small right renal cyst. Incidental note made of a retroaortic left renal vein. No dilated large or small bowel loops. No evidence of bowel wall thickening or bowel wall inflammation seen. No free fluid or abscess collection within the abdomen  or pelvis. No free intraperitoneal air seen. Osseous structures are unremarkable. Lung bases are clear. Review of the MIP images confirms the above findings. IMPRESSION: 1. 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 There is associated diminished flow within the left anterior tibial artery with no appreciable flow in the left anterior tibia artery just above the level of the left ankle. Grossly normal contrast flow is seen within the left peroneal and posterior tibial arteries to the level of the ankle. 2. 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 Reconstitution of flow within the left femoral artery via collaterals with normal contrast flow seen through the left SFA and left popliteal artery. There is also reconstitution of flow in the left  internal iliac artery via collaterals. 3. Normal contrast flow throughout the right lower extremity arteries. 4. No acute appearing findings within the abdomen or pelvis. No evidence of neoplastic process seen. These results were called by telephone at the time of interpretation on 03/17/2017 at 6:30 pm to Dr. Merlyn Lot , who verbally acknowledged these results. Electronically Signed   By: Franki Cabot M.D.   On: 03/17/2017 18:34   Dg Chest Portable 1 View  Result Date: 03/17/2017 CLINICAL DATA:  Cough tobacco use.  Lower extremity claudication EXAM: PORTABLE CHEST 1 VIEW COMPARISON:  None. FINDINGS: The lungs are clear. Heart size and pulmonary vascularity are normal. No adenopathy. No bone lesions. Trachea appears normal. IMPRESSION: No edema or consolidation. Electronically Signed   By: Lowella Grip III M.D.   On: 03/17/2017 16:32    Labs Recent Results (from the past 2160 hour(s))  CBC with Differential/Platelet     Status: None   Collection Time: 03/17/17  4:13 PM  Result Value Ref Range   WBC 8.3 3.8 - 10.6 K/uL   RBC 5.43 4.40 - 5.90 MIL/uL   Hemoglobin 16.6 13.0 - 18.0 g/dL   HCT 50.9 40.0 - 52.0 %   MCV 93.7 80.0 - 100.0 fL   MCH 30.6 26.0 - 34.0 pg   MCHC 32.6 32.0 - 36.0 g/dL   RDW 14.4 11.5 - 14.5 %   Platelets 169 150 - 440 K/uL   Neutrophils Relative % 54 %   Neutro Abs 4.5 1.4 - 6.5 K/uL   Lymphocytes Relative 34 %   Lymphs Abs 2.8 1.0 - 3.6 K/uL   Monocytes Relative 10 %   Monocytes Absolute 0.9 0.2 - 1.0 K/uL   Eosinophils Relative 1 %   Eosinophils Absolute 0.1 0 - 0.7 K/uL   Basophils Relative 1 %   Basophils Absolute 0.1 0 - 0.1 K/uL  Basic metabolic panel     Status: Abnormal   Collection Time: 03/17/17  4:13 PM  Result Value Ref Range   Sodium 139 135 - 145 mmol/L   Potassium 4.2 3.5 - 5.1 mmol/L   Chloride 104 101 - 111 mmol/L   CO2 27 22 - 32 mmol/L   Glucose, Bld 79 65 - 99 mg/dL   BUN 10 6 - 20 mg/dL   Creatinine, Ser 0.91 0.61 - 1.24  mg/dL   Calcium 8.8 (L) 8.9 - 10.3 mg/dL   GFR calc non Af Amer >60 >60 mL/min   GFR calc Af Amer >60 >60 mL/min    Comment: (NOTE) The eGFR has been  calculated using the CKD EPI equation. This calculation has not been validated in all clinical situations. eGFR's persistently <60 mL/min signify possible Chronic Kidney Disease.    Anion gap 8 5 - 15  Protime-INR     Status: None   Collection Time: 03/17/17  4:13 PM  Result Value Ref Range   Prothrombin Time 12.4 11.4 - 15.2 seconds   INR 0.93   APTT     Status: None   Collection Time: 03/17/17  4:13 PM  Result Value Ref Range   aPTT 32 24 - 36 seconds  Lactic acid, plasma     Status: None   Collection Time: 03/17/17  4:14 PM  Result Value Ref Range   Lactic Acid, Venous 1.4 0.5 - 1.9 mmol/L  Heparin level (unfractionated)     Status: None   Collection Time: 03/18/17 12:11 AM  Result Value Ref Range   Heparin Unfractionated 0.56 0.30 - 0.70 IU/mL    Comment:        IF HEPARIN RESULTS ARE BELOW EXPECTED VALUES, AND PATIENT DOSAGE HAS BEEN CONFIRMED, SUGGEST FOLLOW UP TESTING OF ANTITHROMBIN III LEVELS.   HIV antibody (Routine Testing)     Status: None   Collection Time: 03/18/17 12:11 AM  Result Value Ref Range   HIV Screen 4th Generation wRfx Non Reactive Non Reactive    Comment: (NOTE) Performed At: [facility] [place], [place] [id number] Rush Farmer MD RW:[id number]   Basic metabolic panel     Status: Abnormal   Collection Time: 03/18/17 12:11 AM  Result Value Ref Range   Sodium 138 135 - 145 mmol/L   Potassium 3.5 3.5 - 5.1 mmol/L   Chloride 108 101 - 111 mmol/L   CO2 24 22 - 32 mmol/L   Glucose, Bld 126 (H) 65 - 99 mg/dL   BUN 9 6 - 20 mg/dL   Creatinine, Ser 0.83 0.61 - 1.24 mg/dL   Calcium 8.3 (L) 8.9 - 10.3 mg/dL   GFR calc non Af Amer >60 >60 mL/min   GFR calc Af Amer >60 >60 mL/min    Comment: (NOTE) The eGFR has been calculated using the CKD EPI equation. This calculation  has not been validated in all clinical situations. eGFR's persistently <60 mL/min signify possible Chronic Kidney Disease.    Anion gap 6 5 - 15  CBC     Status: None   Collection Time: 03/18/17 12:11 AM  Result Value Ref Range   WBC 7.9 3.8 - 10.6 K/uL   RBC 4.63 4.40 - 5.90 MIL/uL   Hemoglobin 14.1 13.0 - 18.0 g/dL   HCT 42.5 40.0 - 52.0 %   MCV 91.9 80.0 - 100.0 fL   MCH 30.5 26.0 - 34.0 pg   MCHC 33.1 32.0 - 36.0 g/dL   RDW 14.1 11.5 - 14.5 %   Platelets 157 150 - 440 K/uL  Heparin level (unfractionated)     Status: None   Collection Time: 03/18/17  6:08 AM  Result Value Ref Range   Heparin Unfractionated 0.48 0.30 - 0.70 IU/mL    Comment:        IF HEPARIN RESULTS ARE BELOW EXPECTED VALUES, AND PATIENT DOSAGE HAS BEEN CONFIRMED, SUGGEST FOLLOW UP TESTING OF ANTITHROMBIN III LEVELS.   CBC     Status: None   Collection Time: 03/19/17  4:59 AM  Result Value Ref Range   WBC 8.4 3.8 - 10.6 K/uL   RBC 4.58 4.40 - 5.90 MIL/uL   Hemoglobin  13.9 13.0 - 18.0 g/dL   HCT 42.3 40.0 - 52.0 %   MCV 92.3 80.0 - 100.0 fL   MCH 30.3 26.0 - 34.0 pg   MCHC 32.8 32.0 - 36.0 g/dL   RDW 14.1 11.5 - 14.5 %   Platelets 157 150 - 440 K/uL  Hemoglobin A1c     Status: Abnormal   Collection Time: 03/19/17  4:59 AM  Result Value Ref Range   Hgb A1c MFr Bld 5.8 (H) 4.8 - 5.6 %    Comment: (NOTE) Pre diabetes:          5.7%-6.4% Diabetes:              >6.4% Glycemic control for   <7.0% adults with diabetes    Mean Plasma Glucose 119.76 mg/dL    Comment: Performed at [facility], [place] [address], [place], [place] [address]  Basic metabolic panel     Status: Abnormal   Collection Time: 03/19/17  5:02 AM  Result Value Ref Range   Sodium 136 135 - 145 mmol/L   Potassium 4.1 3.5 - 5.1 mmol/L   Chloride 104 101 - 111 mmol/L   CO2 25 22 - 32 mmol/L   Glucose, Bld 100 (H) 65 - 99 mg/dL   BUN 11 6 - 20 mg/dL   Creatinine, Ser 0.83 0.61 - 1.24 mg/dL   Calcium 8.6 (L) 8.9 - 10.3 mg/dL     GFR calc non Af Amer >60 >60 mL/min   GFR calc Af Amer >60 >60 mL/min    Comment: (NOTE) The eGFR has been calculated using the CKD EPI equation. This calculation has not been validated in all clinical situations. eGFR's persistently <60 mL/min signify possible Chronic Kidney Disease.    Anion gap 7 5 - 15  ECHOCARDIOGRAM COMPLETE     Status: Abnormal (In process)   Collection Time: 03/19/17 12:27 PM  Result Value Ref Range   Weight 2,720 oz   Height 72 in   BP 132/68 mmHg   LV PW d 11.8 (A) 0.6 - 1.1 mm   FS 46 (A) 28 - 44 %   LA vol 40.9 mL   LA ID, A-P, ES 35 mm   IVS/LV PW RATIO, ED .65    LV e' LATERAL 16.6 cm/s   LV E/e' medial 4.53    LV E/e'average 4.53    LA diam index 1.77 cm/m2   LA vol A4C 29.7 ml   Area-P 1/2 4.58 cm2   E decel time 165 msec   Peak grad 2 mmHg   E/e' ratio 4.53    MV pk E vel 75.2 m/s   P 1/2 time 48 ms   MV pk A vel 51.9 m/s   LA vol index 20.7 mL/m2   MV Dec 165    LA diam end sys 35.00 mm   TDI e' medial 11.20    TDI e' lateral 16.60    Lateral S' vel 12.60 cm/sec   TAPSE 23.00 mm    Assessment/Plan:  Hyperlipidemia lipid control important in reducing the progression of atherosclerotic disease. Continue statin therapy   Ischemia of left lower extremity Extensive left lower extremity revascularization performed with marked improvement in his symptoms.  Resume all normal activities.  Return to clinic in about a month to recheck noninvasive studies.  Continue current medical regimen.      Leotis Pain 04/05/2017, 5:01 PM   This note was created with Dragon medical transcription system.  Any  errors from dictation are unintentional.

## 2017-04-05 NOTE — Patient Instructions (Signed)

## 2017-04-07 ENCOUNTER — Encounter: Payer: 59 | Admitting: Orthotics

## 2017-04-12 ENCOUNTER — Institutional Professional Consult (permissible substitution): Payer: 59 | Admitting: Cardiovascular Disease

## 2017-04-14 ENCOUNTER — Encounter: Payer: 59 | Admitting: Orthotics

## 2017-04-19 ENCOUNTER — Institutional Professional Consult (permissible substitution): Payer: 59 | Admitting: Cardiovascular Disease

## 2017-04-21 ENCOUNTER — Encounter: Payer: 59 | Admitting: Orthotics

## 2017-04-22 ENCOUNTER — Other Ambulatory Visit (INDEPENDENT_AMBULATORY_CARE_PROVIDER_SITE_OTHER): Payer: Self-pay

## 2017-04-22 MED ORDER — CLOPIDOGREL BISULFATE 75 MG PO TABS: 75.0000 mg | ORAL_TABLET | Freq: Every day | ORAL | 5 refills | Status: DC

## 2017-04-22 MED ORDER — ATORVASTATIN CALCIUM 20 MG PO TABS: 20.0000 mg | ORAL_TABLET | Freq: Every day | ORAL | 0 refills | Status: DC

## 2017-04-22 MED ORDER — ASPIRIN 81 MG PO TBEC: 81.0000 mg | DELAYED_RELEASE_TABLET | Freq: Every day | ORAL | 0 refills | Status: DC

## 2017-05-17 ENCOUNTER — Ambulatory Visit (INDEPENDENT_AMBULATORY_CARE_PROVIDER_SITE_OTHER): Payer: 59 | Admitting: Vascular Surgery

## 2017-05-17 ENCOUNTER — Encounter (INDEPENDENT_AMBULATORY_CARE_PROVIDER_SITE_OTHER): Payer: 59

## 2017-06-08 ENCOUNTER — Encounter (INDEPENDENT_AMBULATORY_CARE_PROVIDER_SITE_OTHER): Payer: 59

## 2017-06-08 ENCOUNTER — Ambulatory Visit (INDEPENDENT_AMBULATORY_CARE_PROVIDER_SITE_OTHER): Payer: 59 | Admitting: Vascular Surgery

## 2017-06-09 ENCOUNTER — Encounter (INDEPENDENT_AMBULATORY_CARE_PROVIDER_SITE_OTHER): Payer: Self-pay

## 2017-06-10 ENCOUNTER — Telehealth (INDEPENDENT_AMBULATORY_CARE_PROVIDER_SITE_OTHER): Payer: Self-pay | Admitting: Vascular Surgery

## 2017-08-17 ENCOUNTER — Other Ambulatory Visit (INDEPENDENT_AMBULATORY_CARE_PROVIDER_SITE_OTHER): Payer: Self-pay | Admitting: Vascular Surgery

## 2017-09-21 ENCOUNTER — Other Ambulatory Visit (INDEPENDENT_AMBULATORY_CARE_PROVIDER_SITE_OTHER): Payer: Self-pay | Admitting: Vascular Surgery

## 2017-11-28 IMAGING — CT CT ANGIO AOBIFEM WO/W CM
2 of 6 series · 10 of 46 positions shown, 11 images · IV contrast (APPLIED)
Comparison: None.

CLINICAL DATA: Severe left foot pain this morning. Intermittent
Doppler pulses.

EXAM:
CT ANGIOGRAPHY AOBIFEM WITHOUT AND WITH CONTRAST<Procedure
Description>CT ANGIOGRAPHY AOBIFEM WITHOUT AND WITH CONTRAST
TECHNIQUE: Axial images were obtained from the level of the lower chest through
the feet after administration of IV contrast material. Coronal and
sagittal reconstructions were performed.
CONTRAST:  125mL QE3RBS-YTQ IOPAMIDOL (QE3RBS-YTQ) INJECTION
76%<Contrast>125mL QE3RBS-YTQ IOPAMIDOL (QE3RBS-YTQ) INJECTION 76%

[Series 4: axial arterial upper · axial · arterial · 0.88mm/px · z∈[-461,+688]mm · 7 of 475 slices shown, 8 images]
[im 46/475  soft-tissue]
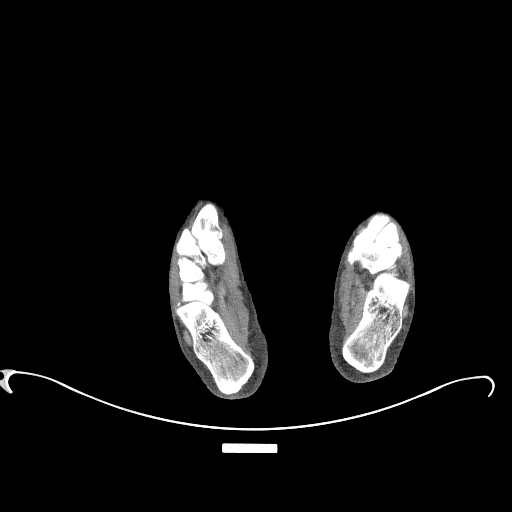
[im 46/475  bone]
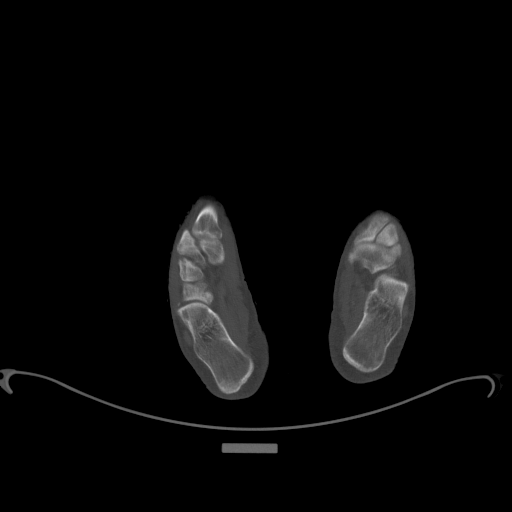
[im 108/475  soft-tissue]
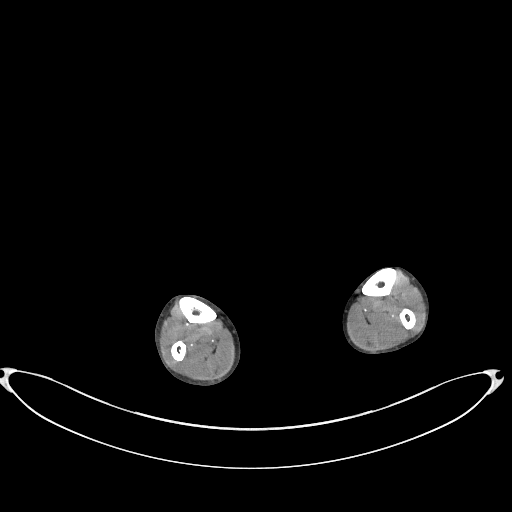
[im 169/475  soft-tissue]
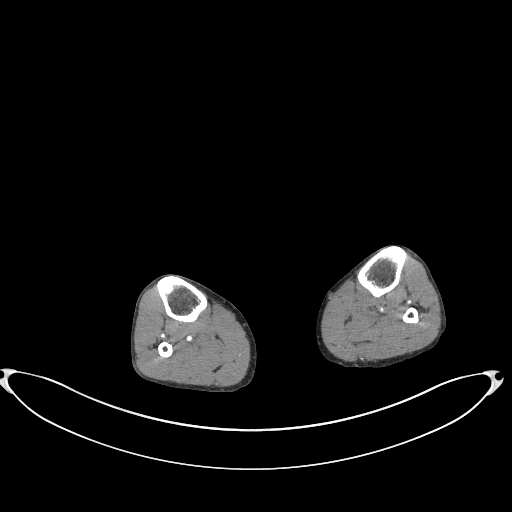
[im 245/475  soft-tissue]
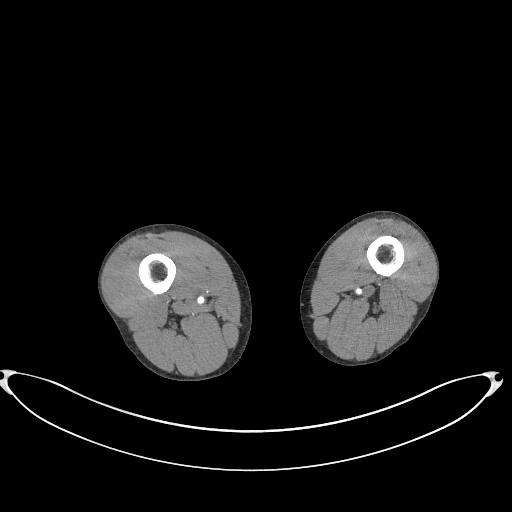
[im 306/475  soft-tissue]
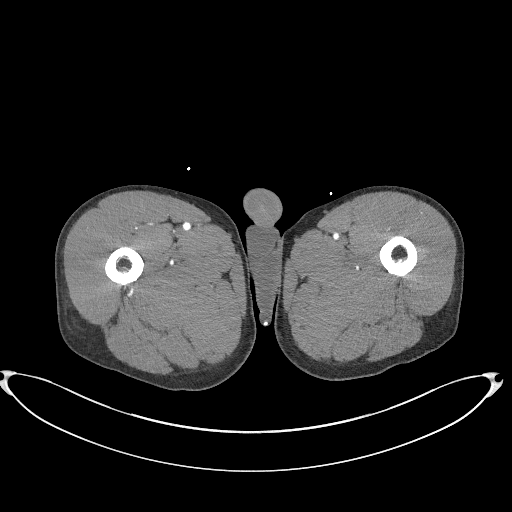
[im 367/475  soft-tissue]
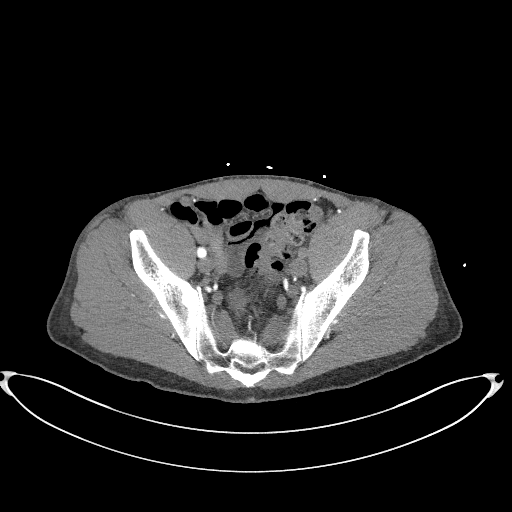
[im 429/475  soft-tissue]
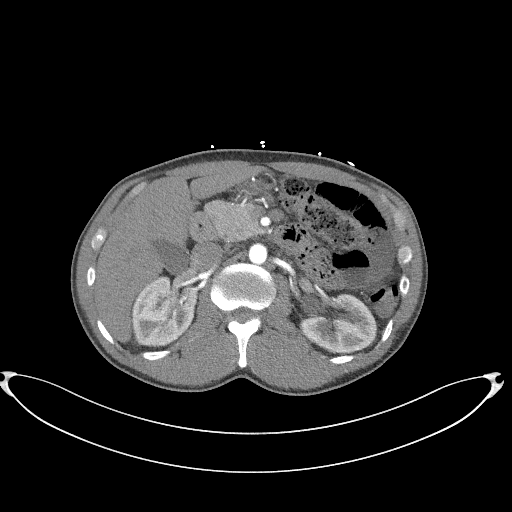

[Series 5: coronal upper · coronal · 0.90mm/px · 3 of 126 slices shown]
[im 32/126  soft-tissue]
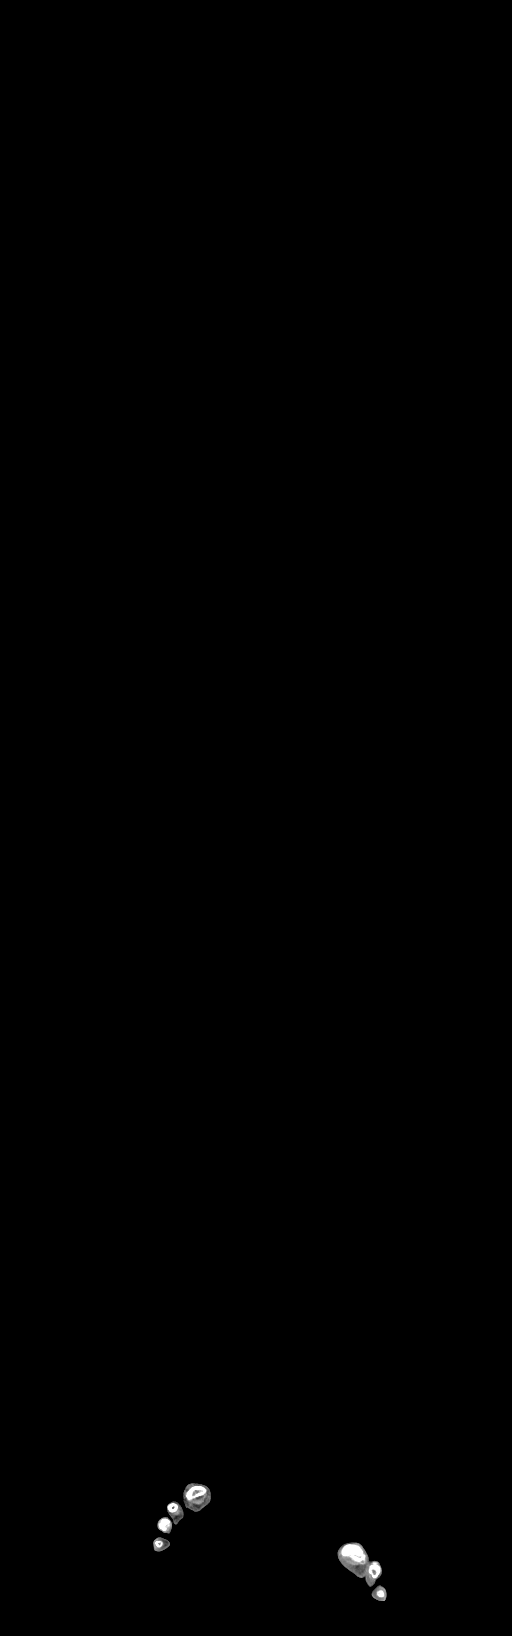
[im 63/126  soft-tissue]
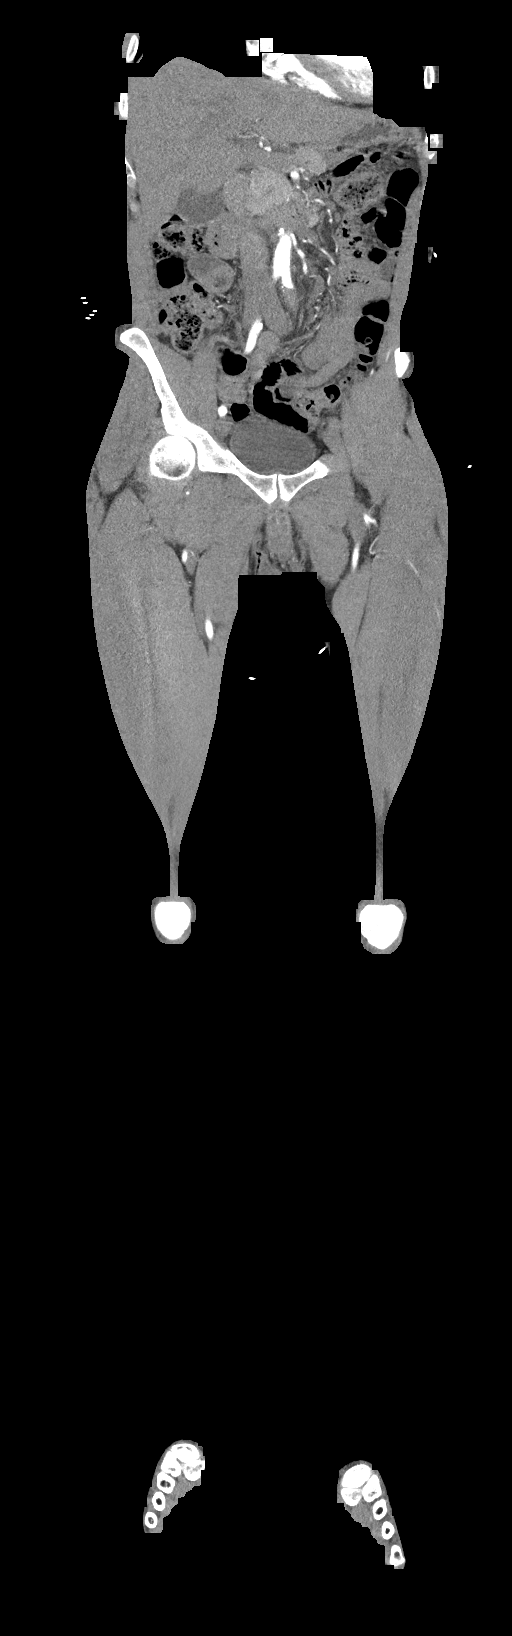
[im 94/126  soft-tissue]
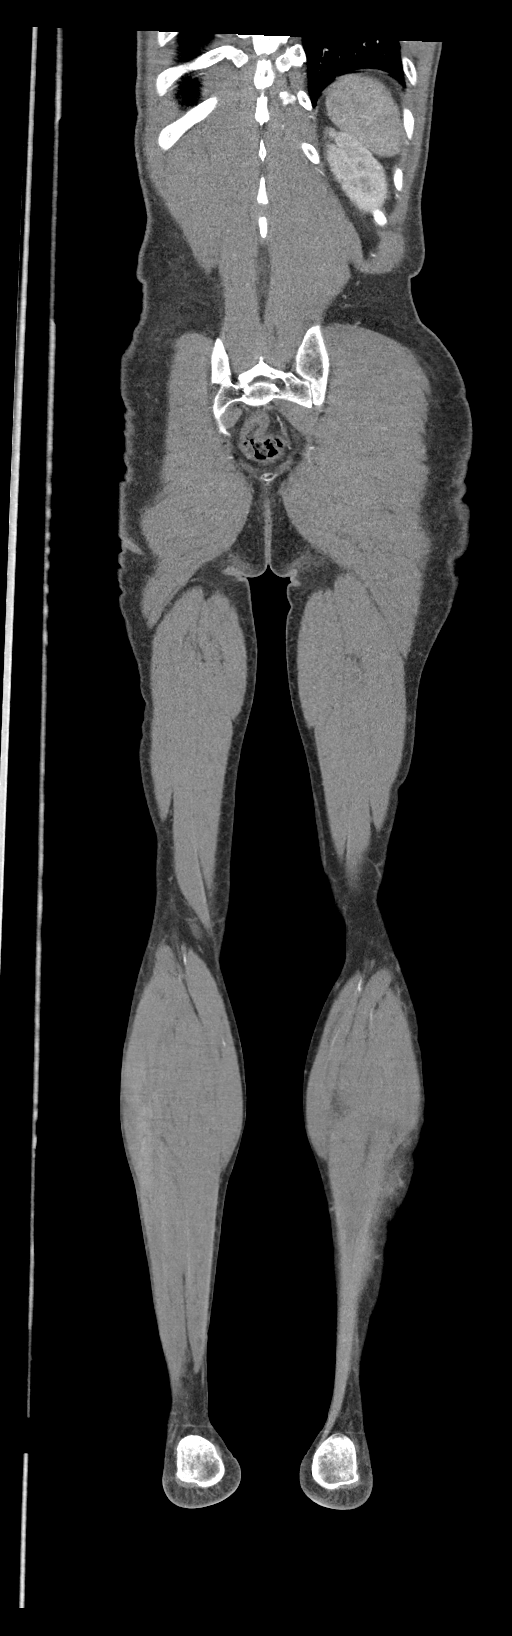

[10 of 46 positions shown; findings below may reference images not displayed]

FINDINGS: Scattered mild atherosclerosis of the normal caliber abdominal
aorta. Normal flow within the celiac, superior mesenteric and
inferior mesenteric artery branches.

Complete occlusion within the proximal portion of the left common
iliac artery. No flow seen within the left external iliac artery.
There is reconstitution of flow within the left internal iliac
artery and at the left femoral artery via collaterals.

Normal flow is seen throughout the left SFA and profundus arteries.
Normal flow is seen within the left popliteal artery.

Partially occlusive thrombus noted at the junction of the left
anterior tibial artery and tibioperoneal trunk. This is reflected by
diminished flow in the anterior tibial artery and no appreciable
flow in the anterior tibial artery just above the level of the left
ankle. Some flow is seen within the left peroneal and posterior
tibial arteries.

There is normal flow throughout the right lower extremity.

Liver, gallbladder, pancreas, and spleen appear normal. Adrenal
glands and kidneys are unremarkable. Small right renal cyst.
Incidental note made of a retroaortic left renal vein.

No dilated large or small bowel loops. No evidence of bowel wall
thickening or bowel wall inflammation seen. No free fluid or abscess
collection within the abdomen or pelvis. No free intraperitoneal air
seen. Osseous structures are unremarkable. Lung bases are clear.

Review of the MIP images confirms the above findings.
IMPRESSION: 1. Partially occlusive thrombus at the junction of the left anterior
tibial artery and tibioperoneal trunk (axial series 4, images 297
through 300 ; coronal reconstructions series 5, images 118 and 119).
There is associated diminished flow within the left anterior tibial
artery with no appreciable flow in the left anterior tibia artery
just above the level of the left ankle. Grossly normal contrast flow
is seen within the left peroneal and posterior tibial arteries to
the level of the ankle.
2. Complete occlusion within the proximal portion of the left common
iliac artery, of uncertain age but most likely chronic.
Reconstitution of flow within the left femoral artery via
collaterals with normal contrast flow seen through the left SFA and
left popliteal artery. There is also reconstitution of flow in the
left internal iliac artery via collaterals.
3. Normal contrast flow throughout the right lower extremity
arteries.
4. No acute appearing findings within the abdomen or pelvis. No
evidence of neoplastic process seen.
These results were called by telephone at the time of interpretation
on 03/17/2017 at [DATE] to Dr. ICHIO NISE , who verbally
acknowledged these results.

## 2018-03-19 ENCOUNTER — Other Ambulatory Visit (INDEPENDENT_AMBULATORY_CARE_PROVIDER_SITE_OTHER): Payer: Self-pay | Admitting: Vascular Surgery

## 2018-07-29 ENCOUNTER — Other Ambulatory Visit (INDEPENDENT_AMBULATORY_CARE_PROVIDER_SITE_OTHER): Payer: Self-pay | Admitting: Vascular Surgery

## 2019-03-06 ENCOUNTER — Other Ambulatory Visit (INDEPENDENT_AMBULATORY_CARE_PROVIDER_SITE_OTHER): Payer: Self-pay | Admitting: Vascular Surgery

## 2019-03-07 NOTE — Telephone Encounter (Signed)
Patient was last seen 04/05/2017 and had schedule appointment 06/08/2017 but did not come in

## 2019-05-25 ENCOUNTER — Other Ambulatory Visit: Payer: Self-pay

## 2019-05-25 ENCOUNTER — Ambulatory Visit
Admission: EM | Admit: 2019-05-25 | Discharge: 2019-05-25 | Disposition: A | Discharge status: Home / Self Care | Payer: BC Managed Care – PPO | Attending: Family Medicine | Admitting: Family Medicine

## 2019-05-25 ENCOUNTER — Encounter: Payer: Self-pay | Admitting: Emergency Medicine

## 2019-05-25 DIAGNOSIS — Z7189 Other specified counseling: Secondary | ICD-10-CM | POA: Diagnosis present

## 2019-05-25 DIAGNOSIS — Z20822 Contact with and (suspected) exposure to covid-19: Secondary | ICD-10-CM | POA: Diagnosis not present

## 2019-05-25 NOTE — Discharge Instructions (Signed)
It was very nice seeing you today in clinic. Thank you for entrusting me with your care.   You were tested for SARS-CoV-2 (novel coronavirus) today. Testing is performed by an outside lab ([facility]) and has variable turn around times ranging between 2-5 days. Current recommendations from the the CDC and [place] DHHS require that you remain out of work in order to quarantine at home until negative test results are have been received. In the event that your test results are positive, you will be contacted with further directives. These measures are being implemented out of an abundance of caution to prevent transmission and spread during the current SARS-CoV-2 pandemic.   If you develop any worsening symptoms or concerns, make arrangements to follow up with your regular doctor. If your symptoms are severe, please seek follow up care in the ER. Please remember, our [facility] providers are "right here with you" when you need us.   Again, it was my pleasure to take care of you today. Thank you for choosing our clinic. I hope that you start to feel better quickly.   Timea Breed, MSN, APRN, FNP-C, CEN Advanced Practice Provider [facility]  

## 2019-05-25 NOTE — ED Triage Notes (Signed)
Patient states that he attended his mother funeral yesterday.  Patient states that his job said he needs a COVID test before he comes back to work.  Patient denies any symptoms.

## 2019-05-26 LAB — NOVEL CORONAVIRUS, NAA (HOSP ORDER, SEND-OUT TO REF LAB; TAT 18-24 HRS): SARS-CoV-2, NAA: NOT DETECTED

## 2019-05-26 NOTE — ED Provider Notes (Signed)
[place], [place]   Name: Barry Lee DOB: 01-03-1961 MRN: [id number] CSN: [id number] PCP: Sofie Hartigan, MD  Arrival date and time:  05/25/19 1557  Chief Complaint:  COVID Test   NOTE: Prior to seeing the patient today, I have reviewed the triage nursing documentation and vital signs. Clinical staff has updated patient's PMH/PSHx, current medication list, and drug allergies/intolerances to ensure comprehensive history available to assist in medical decision making.   History:   HPI: Barry Lee is a 59 y.o. male who presents today with complaints of possible exposure to SARS-CoV-2 (novel coronavirus). Known exposure reported to have occurred over the last 2-3 days, as patient has been dealing with the death of his mother in law. Patient reporting normal funeral home and graveside gatherings. No one in attendance was known to be ill. Patient presents today with no symptoms; no cough, fevers, or other symptoms commonly associated with SARS-CoV-2. He advises that he feels generally well. Patient presents for testing out of concern for his personal health. He adds that he is is being required to provide documentation of negative test results before he will be allowed to return to work.   Past Medical History:  Diagnosis Date  . Hyperlipidemia     Past Surgical History:  Procedure Laterality Date  . COLONOSCOPY WITH PROPOFOL N/A 12/17/2015   Procedure: COLONOSCOPY WITH PROPOFOL;  Surgeon: Lollie Sails, MD;  Location: [facility] ENDOSCOPY;  Service: Endoscopy;  Laterality: N/A;  . LOWER EXTREMITY ANGIOGRAPHY Left 03/18/2017   Procedure: Lower Extremity Angiography;  Surgeon: Algernon Huxley, MD;  Location: [place] CV LAB;  Service: Cardiovascular;  Laterality: Left;    Family History  Problem Relation Age of Onset  . Diabetes Mother   . Alzheimer's disease Father     Social History   Tobacco Use  . Smoking status: Former Smoker    Packs/day: 0.75    Years: 30.00    Pack  years: 22.50    Types: Cigars, Cigarettes  . Smokeless tobacco: Never Used  Substance Use Topics  . Alcohol use: No    Comment: rare  . Drug use: No    Patient Active Problem List   Diagnosis Date Noted  . Hyperlipidemia 04/05/2017  . Ischemia of left lower extremity 03/17/2017    Home Medications:    Current Meds  Medication Sig  . atorvastatin (LIPITOR) 20 MG tablet TAKE 1 TABLET BY MOUTH EVERY DAY  . clopidogrel (PLAVIX) 75 MG tablet TAKE 1 TABLET BY MOUTH EVERY DAY    Allergies:   Patient has no known allergies.  Review of Systems (ROS): Review of Systems  Constitutional: Negative for fatigue and fever.  HENT: Negative for congestion, ear pain, postnasal drip, rhinorrhea, sinus pressure, sinus pain, sneezing and sore throat.   Eyes: Negative for pain, discharge and redness.  Respiratory: Negative for cough, chest tightness and shortness of breath.   Cardiovascular: Negative for chest pain and palpitations.  Gastrointestinal: Negative for abdominal pain, diarrhea, nausea and vomiting.  Musculoskeletal: Negative for arthralgias, back pain, myalgias and neck pain.  Skin: Negative for color change, pallor and rash.  Neurological: Negative for dizziness, syncope, weakness and headaches.  Hematological: Negative for adenopathy.   Vital Signs: Today's Vitals   05/25/19 1609 05/25/19 1611 05/25/19 1618  BP:  (!) 152/89   Pulse:  (!) 53   Resp:  16   Temp:  98.2 F (36.8 C)   TempSrc:  Oral   SpO2:  100%  Weight: 175 lb (79.4 kg)    Height: 6' (1.829 m)    PainSc: 0-No pain  0-No pain    Physical Exam: Physical Exam  Constitutional: She is oriented to person, place, and time and well-developed, well-nourished, and in no distress.  HENT:  Head: Normocephalic and atraumatic.  Eyes: Pupils are equal, round, and reactive to light.  Cardiovascular: Normal rate, regular rhythm, normal heart sounds and intact distal pulses.  Pulmonary/Chest: Effort normal and breath  sounds normal.  Neurological: She is alert and oriented to person, place, and time. Gait normal.  Skin: Skin is warm and dry. No rash noted. She is not diaphoretic.  Psychiatric: Mood, memory, affect and judgment normal.  Nursing note and vitals reviewed.  Urgent Care Treatments / Results:   Orders Placed This Encounter  Procedures  . Novel Coronavirus, NAA (Hosp order, Send-out to Ref Lab; TAT 18-24 hrs    LABS: PLEASE NOTE: all labs that were ordered this encounter are listed, however only abnormal results are displayed. Labs Reviewed  NOVEL CORONAVIRUS, NAA (HOSP ORDER, SEND-OUT TO REF LAB; TAT 18-24 HRS)    EKG: -None  RADIOLOGY: No results found.  PROCEDURES: Procedures  MEDICATIONS RECEIVED THIS VISIT: Medications - No data to display  PERTINENT CLINICAL COURSE NOTES/UPDATES:   Initial Impression / Assessment and Plan / Urgent Care Course:  Pertinent labs & imaging results that were available during my care of the patient were personally reviewed by me and considered in my medical decision making (see lab/imaging section of note for values and interpretations).  Barry Lee is a 59 y.o. male who presents to [facility] today with requests for COVID Test  Patient overall well appearing and in no acute distress today in clinic. Presenting symptoms (see HPI) and exam as documented above. He presents following large gathering (funeral) with possible exposure to SARS-CoV-2 (novel coronavirus). Discussed typical symptom constellation and patient reaffirms that he has none of the commonly associated symptoms seen with SARS-CoV-2 infection. Reviewed potential for infection with recent close contact. Given exposure and potential for infection, testing is reasonable. SARS-CoV-2 swab collected by certified clinical staff. Discussed variable turn around times associated with testing, as swabs are being processed at [facility], and have been between 24-48 hours to come back. He  was advised to self quarantine, per [facility] DHHS guidelines, until negative results received.   Current clinical condition warrants patient being out of work in order to quarantine while waiting for testing results. He was provided with the appropriate documentation to provide to his place of employment that will allow for him to RTW on XXX with no restrictions. RTW is contingent on his SARS-CoV-2 test results being reviewed as negative.    Discussed follow up with primary care physician should he develop any concerning symptoms. I have reviewed the follow up and strict return precautions for any new or worsening symptoms. Patient is aware of symptoms that would be deemed urgent/emergent, and would thus require further evaluation either here or in the emergency department. At the time of discharge, he verbalized understanding and consent with the discharge plan as it was reviewed with him. All questions were fielded by provider and/or clinic staff prior to patient discharge.     Final Clinical Impressions / Urgent Care Diagnoses:   Final diagnoses:  Encounter for laboratory testing for COVID-19 virus  Advice given about COVID-19 virus infection    New Prescriptions:  [place] Controlled Substance Registry consulted? Not Applicable  No orders of the  defined types were placed in this encounter.   Recommended Follow up Care:  Patient encouraged to follow up with the following provider within the specified time frame, or sooner as dictated by the severity of his symptoms. As always, he was instructed that for any urgent/emergent care needs, he should seek care either here or in the emergency department for more immediate evaluation.  Follow-up Information    Feldpausch, Chrissie Noa, MD.   Specialty: Family Medicine Why: As needed Contact information: [place] [address] [phone]         NOTE: This note was prepared using Dragon dictation software along with smaller phrase  technology. Despite my best ability to proofread, there is the potential that transcriptional errors may still occur from this process, and are completely unintentional.    Karen Kitchens, NP 05/26/19 1338

## 2019-09-04 ENCOUNTER — Telehealth (INDEPENDENT_AMBULATORY_CARE_PROVIDER_SITE_OTHER): Payer: Self-pay | Admitting: Vascular Surgery

## 2019-09-04 NOTE — Telephone Encounter (Signed)
Patient was to come back in 4 weeks for a LLE ultrasound and ABI but no showed. Please reschedule for this along with appt with Dew please. Thank you

## 2019-09-04 NOTE — Telephone Encounter (Addendum)
Patient called wanting to come in for appt as a new patient. He says the medicine is working and he isn't having any issues. But would like his RX to be refilled. Patient last seen 04-05-17 as new patient w/ JD. Please advise.

## 2019-09-12 ENCOUNTER — Encounter (INDEPENDENT_AMBULATORY_CARE_PROVIDER_SITE_OTHER): Payer: 59

## 2019-10-04 ENCOUNTER — Other Ambulatory Visit (INDEPENDENT_AMBULATORY_CARE_PROVIDER_SITE_OTHER): Payer: Self-pay | Admitting: Vascular Surgery

## 2019-10-04 DIAGNOSIS — Z95820 Peripheral vascular angioplasty status with implants and grafts: Secondary | ICD-10-CM

## 2019-10-04 DIAGNOSIS — I739 Peripheral vascular disease, unspecified: Secondary | ICD-10-CM

## 2019-10-05 ENCOUNTER — Other Ambulatory Visit: Payer: Self-pay

## 2019-10-05 ENCOUNTER — Ambulatory Visit (INDEPENDENT_AMBULATORY_CARE_PROVIDER_SITE_OTHER): Payer: BC Managed Care – PPO | Admitting: Nurse Practitioner

## 2019-10-05 ENCOUNTER — Encounter (INDEPENDENT_AMBULATORY_CARE_PROVIDER_SITE_OTHER): Payer: Self-pay | Admitting: Nurse Practitioner

## 2019-10-05 ENCOUNTER — Ambulatory Visit (INDEPENDENT_AMBULATORY_CARE_PROVIDER_SITE_OTHER): Payer: BC Managed Care – PPO

## 2019-10-05 VITALS — BP 134/86 | HR 54 | Resp 16 | Wt 163.6 lb

## 2019-10-05 DIAGNOSIS — Z95820 Peripheral vascular angioplasty status with implants and grafts: Secondary | ICD-10-CM | POA: Diagnosis not present

## 2019-10-05 DIAGNOSIS — I739 Peripheral vascular disease, unspecified: Secondary | ICD-10-CM

## 2019-10-05 DIAGNOSIS — I998 Other disorder of circulatory system: Secondary | ICD-10-CM

## 2019-10-05 DIAGNOSIS — E785 Hyperlipidemia, unspecified: Secondary | ICD-10-CM

## 2019-10-05 DIAGNOSIS — L84 Corns and callosities: Secondary | ICD-10-CM | POA: Diagnosis not present

## 2019-10-05 MED ORDER — CLOPIDOGREL BISULFATE 75 MG PO TABS: 75.0000 mg | ORAL_TABLET | Freq: Every day | ORAL | 3 refills | Status: DC

## 2019-10-05 MED ORDER — ATORVASTATIN CALCIUM 20 MG PO TABS: 20.0000 mg | ORAL_TABLET | Freq: Every day | ORAL | 3 refills | Status: DC

## 2019-10-09 ENCOUNTER — Encounter (INDEPENDENT_AMBULATORY_CARE_PROVIDER_SITE_OTHER): Payer: Self-pay | Admitting: Nurse Practitioner

## 2019-10-09 NOTE — Progress Notes (Signed)
Subjective:    Patient ID: Barry Lee, male    DOB: Jul 09, 1960, 59 y.o.   MRN: [id number] Chief Complaint  Patient presents with  . Follow-up    ultrasound followup    The patient returns to the office for followup and review of the noninvasive studies.  The patient initially had intervention on his left lower extremity due to acute arterial thrombosis on 03/18/2017.  However, the patient had not been seen in office since his first follow-up visit.  There have been no interval changes in lower extremity symptoms. No interval shortening of the patient's claudication distance or development of rest pain symptoms. No new ulcers or wounds have occurred since the last visit.  There have been no significant changes to the patient's overall health care.  The patient denies amaurosis fugax or recent TIA symptoms. There are no recent neurological changes noted. The patient denies history of DVT, PE or superficial thrombophlebitis. The patient denies recent episodes of angina or shortness of breath.   ABI Rt=1.18 and Lt=0.85 (no previous ABIs) Duplex ultrasound of the right lower extremity reveals triphasic waveforms tibial artery waveforms.  The left lower extremity underwent arterial duplex which revealed triphasic waveforms to the level of the distal SFA where it transitions to monophasic waveforms.  These waveforms appear to be very strong almost biphasic in the left tibial arteries.  The patient has strong toe waveforms bilaterally.   Review of Systems  Cardiovascular:       Claudication  All other systems reviewed and are negative.      Objective:   Physical Exam Vitals reviewed.  Cardiovascular:     Pulses:          Dorsalis pedis pulses are 1+ on the right side and 1+ on the left side.       Posterior tibial pulses are 1+ on the right side and 1+ on the left side.     Heart sounds: Normal heart sounds.  Pulmonary:     Effort: Pulmonary effort is normal.     Breath sounds:  Normal breath sounds.  Feet:     Right foot:     Skin integrity: Callus present.     Left foot:     Skin integrity: Callus present.  Skin:    Capillary Refill: Capillary refill takes less than 2 seconds.  Neurological:     Mental Status: He is alert and oriented to person, place, and time.  Psychiatric:        Mood and Affect: Mood normal.        Behavior: Behavior normal.        Thought Content: Thought content normal.        Judgment: Judgment normal.     BP 134/86 (BP Location: Right Arm)   Pulse (!) 54   Resp 16   Wt 163 lb 9.6 oz (74.2 kg)   BMI 22.19 kg/m   Past Medical History:  Diagnosis Date  . Hyperlipidemia     Social History   Socioeconomic History  . Marital status: Married    Spouse name: Not on file  . Number of children: Not on file  . Years of education: Not on file  . Highest education level: Not on file  Occupational History  . Not on file  Tobacco Use  . Smoking status: Former Smoker    Packs/day: 0.75    Years: 30.00    Pack years: 22.50    Types: Cigars, Cigarettes  . Smokeless  tobacco: Never Used  Substance and Sexual Activity  . Alcohol use: No    Comment: rare  . Drug use: No  . Sexual activity: Not on file  Other Topics Concern  . Not on file  Social History Narrative  . Not on file   Social Determinants of Health   Financial Resource Strain:   . Difficulty of Paying Living Expenses:   Food Insecurity:   . Worried About Charity fundraiser in the Last Year:   . Arboriculturist in the Last Year:   Transportation Needs:   . Film/video editor (Medical):   Marland Kitchen Lack of Transportation (Non-Medical):   Physical Activity:   . Days of Exercise per Week:   . Minutes of Exercise per Session:   Stress:   . Feeling of Stress :   Social Connections:   . Frequency of Communication with Friends and Family:   . Frequency of Social Gatherings with Friends and Family:   . Attends Religious Services:   . Active Member of Clubs or  Organizations:   . Attends Archivist Meetings:   Marland Kitchen Marital Status:   Intimate Partner Violence:   . Fear of Current or Ex-Partner:   . Emotionally Abused:   Marland Kitchen Physically Abused:   . Sexually Abused:     Past Surgical History:  Procedure Laterality Date  . COLONOSCOPY WITH PROPOFOL N/A 12/17/2015   Procedure: COLONOSCOPY WITH PROPOFOL;  Surgeon: Lollie Sails, MD;  Location: [facility] ENDOSCOPY;  Service: Endoscopy;  Laterality: N/A;  . LOWER EXTREMITY ANGIOGRAPHY Left 03/18/2017   Procedure: Lower Extremity Angiography;  Surgeon: Algernon Huxley, MD;  Location: [place] CV LAB;  Service: Cardiovascular;  Laterality: Left;    Family History  Problem Relation Age of Onset  . Diabetes Mother   . Alzheimer's disease Father     No Known Allergies     Assessment & Plan:   1. Hyperlipidemia, unspecified hyperlipidemia type Continue statin as ordered and reviewed, no changes at this time   2. Ischemia of left lower extremity  Recommend:  The patient has evidence of atherosclerosis of the lower extremities with claudication.  The patient does not voice lifestyle limiting changes at this point in time.  Noninvasive studies do not suggest clinically significant change.  No invasive studies, angiography or surgery at this time The patient should continue walking and begin a more formal exercise program.  The patient should continue antiplatelet therapy and aggressive treatment of the lipid abnormalities  No changes in the patient's medications at this time  The patient should continue wearing graduated compression socks 10-15 mmHg strength to control the mild edema.   Patient will return for noninvasive studies in 1 year - clopidogrel (PLAVIX) 75 MG tablet; Take 1 tablet (75 mg total) by mouth daily.  Dispense: 90 tablet; Refill: 3 - atorvastatin (LIPITOR) 20 MG tablet; Take 1 tablet (20 mg total) by mouth daily.  Dispense: 90 tablet; Refill: 3  3. Foot  callus Patient denies any claudication or rest pain like symptoms, his biggest concern is with the calluses on the bottom of his feet.  Despite multiple treatments they continue to recur.  This is causing him significant distress.  We will place her referral for second opinion for the patient. - Ambulatory referral to Podiatry   Current Outpatient Medications on File Prior to Visit  Medication Sig Dispense Refill  . acetaminophen (TYLENOL) 325 MG tablet Take 2 tablets (650 mg total) every 6 (  six) hours as needed by mouth for mild pain (or Fever >/= 101).    . feeding supplement, ENSURE ENLIVE, (ENSURE ENLIVE) LIQD Take 237 mLs 2 (two) times daily between meals by mouth. 60 Bottle 0  . HYDROcodone-acetaminophen (NORCO/VICODIN) 5-325 MG tablet Take 1 tablet every 6 (six) hours as needed by mouth for severe pain. (Patient not taking: Reported on 04/05/2017) 10 tablet 0  . Urea 39 % CREA Apply 1 application topically daily. (Patient not taking: Reported on 10/05/2019) 1 Bottle 0   No current facility-administered medications on file prior to visit.    There are no Patient Instructions on file for this visit. No follow-ups on file.   Kris Hartmann, NP

## 2019-10-15 ENCOUNTER — Other Ambulatory Visit: Payer: Self-pay

## 2019-10-15 ENCOUNTER — Ambulatory Visit (INDEPENDENT_AMBULATORY_CARE_PROVIDER_SITE_OTHER): Payer: BC Managed Care – PPO | Admitting: Podiatry

## 2019-10-15 ENCOUNTER — Encounter: Payer: Self-pay | Admitting: Podiatry

## 2019-10-15 DIAGNOSIS — L84 Corns and callosities: Secondary | ICD-10-CM

## 2019-10-15 DIAGNOSIS — Q828 Other specified congenital malformations of skin: Secondary | ICD-10-CM | POA: Diagnosis not present

## 2019-10-15 NOTE — Progress Notes (Signed)
This patient presents to the office stating that he has multiple calluses on both feet and these calluses have become extremely painful to walk.  He says that he does significant walking and standing at work.  He was seen 3 years ago in this office and requested insoles to be worn in his shoe.  He says he has never received the insoles.  He says he has dealt with the pain from the calluses and he desires treatment and evaluation of the callus both feet today.     Vascular  Dorsalis pedis and posterior tibial pulses are palpable  Right foot.  Dorsalis pedis is not palpable left foot and posterior tibial pulses are weakly palpable.  .  Capillary return  WNL.  Temperature gradient is  WNL.  Skin turgor  WNL  Sensorium  Senn Weinstein monofilament wire  WNL. Normal tactile sensation.  Nail Exam  Patient has normal nails with no evidence of bacterial or fungal infection.  Elongated nails noted.  Orthopedic  Exam  Muscle tone and muscle strength  WNL.  No limitations of motion feet  B/L.  No crepitus or joint effusion noted.  Foot type is unremarkable and digits show no abnormalities.  HAV  B/L.  Skin  No open lesions.  Normal skin texture and turgor.  Pinch callus B/l.  Heel porokeratosis right foot.  Porokeratosis sub 2 left foot and sub 5th met right foot.  Callus/Porokeratosis  Feet  B/L.  Debridement of porokeratosis/callus  With # 15 blade.  Recommended pumice stone for callus.   Recommend 3/4 spenco orthoses for his feet.  RTC 10 weeks.  To check on the status of 59 year old orthoses.   Gardiner Barefoot DPM

## 2019-12-24 ENCOUNTER — Ambulatory Visit: Payer: BC Managed Care – PPO | Admitting: Podiatry

## 2019-12-24 ENCOUNTER — Encounter: Payer: Self-pay | Admitting: Podiatry

## 2019-12-24 ENCOUNTER — Other Ambulatory Visit: Payer: Self-pay

## 2019-12-24 DIAGNOSIS — Q828 Other specified congenital malformations of skin: Secondary | ICD-10-CM | POA: Diagnosis not present

## 2019-12-24 DIAGNOSIS — L84 Corns and callosities: Secondary | ICD-10-CM | POA: Diagnosis not present

## 2019-12-24 NOTE — Progress Notes (Signed)
This patient presents to the office stating that he has multiple calluses on both feet and these calluses have become extremely painful when they get thick.  He says that he does significant walking and standing at work.  He says he has dealt with the pain from the calluses and he desires treatment and evaluation of the callus both feet today.     Vascular  Dorsalis pedis and posterior tibial pulses are palpable  Right foot.  Dorsalis pedis is not palpable left foot and posterior tibial pulses are weakly palpable.  .  Capillary return  WNL.  Temperature gradient is  WNL.  Skin turgor  WNL  Sensorium  Senn Weinstein monofilament wire  WNL. Normal tactile sensation.  Nail Exam  Patient has normal nails with no evidence of bacterial or fungal infection.  Elongated nails noted.  Orthopedic  Exam  Muscle tone and muscle strength  WNL.  No limitations of motion feet  B/L.  No crepitus or joint effusion noted.  Foot type is unremarkable and digits show no abnormalities.  HAV  B/L.  Skin  No open lesions.  Normal skin texture and turgor.  Pinch callus right foot.  Heel porokeratosis right foot.  Porokeratosis sub 2 left foot and sub 5th met right foot.  Callus/Porokeratosis  Feet  B/L.  Debridement of porokeratosis/callus  With # 15 blade.  Recommended pumice stone for callus.   Recommend 3/4 spenco orthoses for his feet.  RTC 10 weeks.   Gardiner Barefoot DPM

## 2020-03-03 ENCOUNTER — Ambulatory Visit: Payer: BC Managed Care – PPO | Admitting: Podiatry

## 2020-03-13 ENCOUNTER — Other Ambulatory Visit: Payer: Self-pay

## 2020-03-13 ENCOUNTER — Encounter: Payer: Self-pay | Admitting: Podiatry

## 2020-03-13 ENCOUNTER — Ambulatory Visit: Payer: BC Managed Care – PPO | Admitting: Podiatry

## 2020-03-13 DIAGNOSIS — Q828 Other specified congenital malformations of skin: Secondary | ICD-10-CM | POA: Diagnosis not present

## 2020-03-13 DIAGNOSIS — L84 Corns and callosities: Secondary | ICD-10-CM | POA: Diagnosis not present

## 2020-03-13 NOTE — Progress Notes (Signed)
This patient presents to the office stating that he has multiple calluses on both feet and these calluses have become extremely painful when they get thick.  He says that he does significant walking and standing at work.  He says he has dealt with the pain from the calluses and he desires treatment and evaluation of the callus both feet today.     Vascular  Dorsalis pedis and posterior tibial pulses are palpable  Right foot.  Dorsalis pedis is not palpable left foot and posterior tibial pulses are weakly palpable.  .  Capillary return  WNL.  Temperature gradient is  WNL.  Skin turgor  WNL  Sensorium  Senn Weinstein monofilament wire  WNL. Normal tactile sensation.  Nail Exam  Patient has normal nails with no evidence of bacterial or fungal infection.  Elongated nails noted.  Orthopedic  Exam  Muscle tone and muscle strength  WNL.  No limitations of motion feet  B/L.  No crepitus or joint effusion noted.  Foot type is unremarkable and digits show no abnormalities.  HAV  B/L.  Hammer toe second left.  Skin  No open lesions.  Normal skin texture and turgor.  Pinch callus B/L.Marland Kitchen  Heel porokeratosis right foot.  Porokeratosis sub 2 left foot and sub 5th met right foot.  Callus/Porokeratosis  Feet  B/L.  Debridement of porokeratosis/callus  With # 15 blade.  Prescribed gel dispersion pad sheet.  Discussed surgery in future.    RTC 10 weeks.   Gardiner Barefoot DPM

## 2020-05-26 ENCOUNTER — Ambulatory Visit: Payer: BC Managed Care – PPO | Admitting: Podiatry

## 2020-10-02 ENCOUNTER — Other Ambulatory Visit (INDEPENDENT_AMBULATORY_CARE_PROVIDER_SITE_OTHER): Payer: Self-pay | Admitting: Nurse Practitioner

## 2020-10-02 DIAGNOSIS — I998 Other disorder of circulatory system: Secondary | ICD-10-CM

## 2020-10-07 ENCOUNTER — Encounter (INDEPENDENT_AMBULATORY_CARE_PROVIDER_SITE_OTHER): Payer: BC Managed Care – PPO

## 2020-10-07 ENCOUNTER — Encounter (INDEPENDENT_AMBULATORY_CARE_PROVIDER_SITE_OTHER): Payer: Self-pay

## 2020-10-07 ENCOUNTER — Ambulatory Visit (INDEPENDENT_AMBULATORY_CARE_PROVIDER_SITE_OTHER): Payer: BC Managed Care – PPO | Admitting: Vascular Surgery

## 2020-10-08 ENCOUNTER — Encounter (INDEPENDENT_AMBULATORY_CARE_PROVIDER_SITE_OTHER): Payer: Self-pay | Admitting: Vascular Surgery

## 2020-10-12 ENCOUNTER — Other Ambulatory Visit (INDEPENDENT_AMBULATORY_CARE_PROVIDER_SITE_OTHER): Payer: Self-pay | Admitting: Nurse Practitioner

## 2020-10-12 DIAGNOSIS — I998 Other disorder of circulatory system: Secondary | ICD-10-CM

## 2020-10-27 ENCOUNTER — Other Ambulatory Visit (INDEPENDENT_AMBULATORY_CARE_PROVIDER_SITE_OTHER): Payer: Self-pay | Admitting: Nurse Practitioner

## 2020-10-27 DIAGNOSIS — I998 Other disorder of circulatory system: Secondary | ICD-10-CM
